# Patient Record
Sex: Male | Born: 1965 | Race: White | Hispanic: No | Marital: Married | Smoking: Former smoker
Health system: Southern US, Community
[De-identification: ages and names within clinical notes are randomized; demographics above are authoritative.]

## PROBLEM LIST (undated history)

## (undated) DIAGNOSIS — J439 Emphysema, unspecified: Secondary | ICD-10-CM

## (undated) DIAGNOSIS — G4730 Sleep apnea, unspecified: Secondary | ICD-10-CM

## (undated) DIAGNOSIS — I5032 Chronic diastolic (congestive) heart failure: Secondary | ICD-10-CM

## (undated) DIAGNOSIS — E785 Hyperlipidemia, unspecified: Secondary | ICD-10-CM

## (undated) DIAGNOSIS — I351 Nonrheumatic aortic (valve) insufficiency: Secondary | ICD-10-CM

## (undated) DIAGNOSIS — R011 Cardiac murmur, unspecified: Secondary | ICD-10-CM

## (undated) DIAGNOSIS — I10 Essential (primary) hypertension: Principal | ICD-10-CM

## (undated) DIAGNOSIS — E782 Mixed hyperlipidemia: Secondary | ICD-10-CM

## (undated) DIAGNOSIS — K760 Fatty (change of) liver, not elsewhere classified: Secondary | ICD-10-CM

## (undated) DIAGNOSIS — K219 Gastro-esophageal reflux disease without esophagitis: Secondary | ICD-10-CM

## (undated) DIAGNOSIS — M24572 Contracture, left ankle: Secondary | ICD-10-CM

## (undated) DIAGNOSIS — M722 Plantar fascial fibromatosis: Secondary | ICD-10-CM

## (undated) HISTORY — DX: Plantar fascial fibromatosis: M72.2

## (undated) HISTORY — DX: Mixed hyperlipidemia: E78.2

## (undated) HISTORY — DX: Contracture, left ankle: M24.572

## (undated) HISTORY — DX: Nonrheumatic aortic (valve) insufficiency: I35.1

## (undated) HISTORY — DX: Hyperlipidemia, unspecified: E78.5

## (undated) HISTORY — DX: Essential (primary) hypertension: I10

---

## 2015-05-07 DIAGNOSIS — E785 Hyperlipidemia, unspecified: Secondary | ICD-10-CM

## 2015-05-07 DIAGNOSIS — I351 Nonrheumatic aortic (valve) insufficiency: Secondary | ICD-10-CM

## 2015-05-07 DIAGNOSIS — I342 Nonrheumatic mitral (valve) stenosis: Secondary | ICD-10-CM | POA: Insufficient documentation

## 2015-05-07 HISTORY — DX: Hyperlipidemia, unspecified: E78.5

## 2015-05-07 HISTORY — DX: Nonrheumatic aortic (valve) insufficiency: I35.1

## 2015-07-17 DIAGNOSIS — M722 Plantar fascial fibromatosis: Secondary | ICD-10-CM

## 2015-07-17 DIAGNOSIS — M24572 Contracture, left ankle: Secondary | ICD-10-CM

## 2015-07-17 HISTORY — DX: Contracture, left ankle: M24.572

## 2015-07-17 HISTORY — DX: Plantar fascial fibromatosis: M72.2

## 2017-03-04 DIAGNOSIS — I10 Essential (primary) hypertension: Secondary | ICD-10-CM

## 2017-03-04 DIAGNOSIS — E782 Mixed hyperlipidemia: Secondary | ICD-10-CM

## 2017-03-04 HISTORY — DX: Mixed hyperlipidemia: E78.2

## 2017-03-04 HISTORY — DX: Essential (primary) hypertension: I10

## 2017-03-12 ENCOUNTER — Other Ambulatory Visit: Payer: Self-pay | Admitting: Physician Assistant

## 2017-03-12 DIAGNOSIS — M5412 Radiculopathy, cervical region: Secondary | ICD-10-CM

## 2017-03-19 ENCOUNTER — Ambulatory Visit
Admission: RE | Admit: 2017-03-19 | Discharge: 2017-03-19 | Disposition: A | Discharge status: Home / Self Care | Payer: BLUE CROSS/BLUE SHIELD | Source: Ambulatory Visit | Attending: Physician Assistant | Admitting: Physician Assistant

## 2017-03-19 DIAGNOSIS — M5412 Radiculopathy, cervical region: Secondary | ICD-10-CM

## 2017-09-10 ENCOUNTER — Encounter: Payer: Self-pay | Admitting: Cardiology

## 2017-09-10 ENCOUNTER — Ambulatory Visit: Payer: BLUE CROSS/BLUE SHIELD | Admitting: Cardiology

## 2017-09-10 VITALS — BP 130/60 | HR 70 | Ht 72.0 in | Wt 257.0 lb

## 2017-09-10 DIAGNOSIS — E785 Hyperlipidemia, unspecified: Secondary | ICD-10-CM | POA: Diagnosis not present

## 2017-09-10 DIAGNOSIS — I351 Nonrheumatic aortic (valve) insufficiency: Secondary | ICD-10-CM

## 2017-09-10 DIAGNOSIS — I10 Essential (primary) hypertension: Secondary | ICD-10-CM

## 2017-09-10 NOTE — Progress Notes (Signed)
Cardiology Office Note:    Date:  09/10/2017   ID:  Jackson Roberts, DOB 13-Jan-1966, MRN [id number]  PCP:  Nonnie Done., MD  Cardiologist:  Gypsy Balsam, MD    Referring MD: Nonnie Done., MD   Chief Complaint  Patient presents with  . Annual Exam  Doing well  History of Present Illness:    Jackson Roberts is a 52 y.o. male with aortic regurgitation as well as aortic stenosis and dyslipidemia overall clinically doing well denies have any chest pain tightness squeezing pressure burning chest no dizziness no passing out.  He described the situation when he was working hard outside and became kind of overheated.  No dizziness no passing out.  Past Medical History:  Diagnosis Date  . 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 Dyslipidemia 05/07/2015  . Essential hypertension 03/04/2017  . Mixed hyperlipidemia 03/04/2017  . Nonrheumatic aortic valve insufficiency 05/07/2015   Moderate in degree  . Plantar fasciitis of right foot 07/17/2015    History reviewed. No pertinent surgical history.  Current Medications: Current Meds  Medication Sig  . amLODipine (NORVASC) 5 MG tablet Take 5 mg by mouth daily.  . Ascorbic Acid (VITAMIN C) 100 MG tablet Take by mouth.  . Cholecalciferol (VITAMIN D3) 5000 units TABS Take by mouth.  . lansoprazole (PREVACID) 15 MG capsule Take by mouth daily.  Marland Kitchen losartan (COZAAR) 100 MG tablet daily.  . Multiple Vitamin (MULTI-VITAMINS) TABS Take by mouth.  . pravastatin (PRAVACHOL) 10 MG tablet Take 5 mg by mouth daily.  . sildenafil (REVATIO) 20 MG tablet TAKE FIVE TABLETS DAILY AS NEEDED  . testosterone cypionate (DEPOTESTOSTERONE CYPIONATE) 200 MG/ML injection   . TOPROL XL 50 MG 24 hr tablet daily.     Allergies:   Penicillin g and Prednisone   Social History   Socioeconomic History  . Marital status: Married    Spouse name: Not on file  . Number of children: Not on file  . Years of education: Not on file  . Highest education  level: Not on file  Occupational History  . Not on file  Social Needs  . Financial resource strain: Not on file  . Food insecurity:    Worry: Not on file    Inability: Not on file  . Transportation needs:    Medical: Not on file    Non-medical: Not on file  Tobacco Use  . Smoking status: Former Smoker    Types: Cigarettes    Last attempt to quit: 09/11/2007    Years since quitting: 10.0  . Smokeless tobacco: Never Used  Substance and Sexual Activity  . Alcohol use: Yes    Alcohol/week: 1.0 standard drinks    Types: 1 Cans of beer per week  . Drug use: Never  . Sexual activity: Not on file  Lifestyle  . Physical activity:    Days per week: Not on file    Minutes per session: Not on file  . Stress: Not on file  Relationships  . Social connections:    Talks on phone: Not on file    Gets together: Not on file    Attends religious service: Not on file    Active member of club or organization: Not on file    Attends meetings of clubs or organizations: Not on file    Relationship status: Not on file  Other Topics Concern  . Not on file  Social History Narrative  . Not on file     Family  History: The patient's family history includes Bone cancer in his maternal grandfather; Hypertension in his father and mother; Mitral valve prolapse in his mother; Stroke in his maternal grandfather. ROS:   Please see the history of present illness.    All 14 point review of systems negative except as described per history of present illness  EKGs/Labs/Other Studies Reviewed:      Recent Labs: No results found for requested labs within last 8760 hours.  Recent Lipid Panel No results found for: CHOL, TRIG, HDL, CHOLHDL, VLDL, LDLCALC, LDLDIRECT  Physical Exam:    VS:  BP 130/60 (BP Location: Right Arm, Patient Position: Sitting, Cuff Size: Normal)   Pulse 70   Ht 6' (1.829 m)   Wt 257 lb (116.6 kg)   SpO2 97%   BMI 34.86 kg/m     Wt Readings from Last 3 Encounters:  09/10/17  257 lb (116.6 kg)     GEN:  Well nourished, well developed in no acute distress HEENT: Normal NECK: No JVD; No carotid bruits LYMPHATICS: No lymphadenopathy CARDIAC: RRR, no murmurs, no rubs, no gallops RESPIRATORY:  Clear to auscultation without rales, wheezing or rhonchi  ABDOMEN: Soft, non-tender, non-distended MUSCULOSKELETAL:  No edema; No deformity  SKIN: Warm and dry LOWER EXTREMITIES: no swelling NEUROLOGIC:  Alert and oriented x 3 PSYCHIATRIC:  Normal affect   ASSESSMENT:    1. Essential hypertension   2. Nonrheumatic aortic valve insufficiency   3. Dyslipidemia    PLAN:    In order of problems listed above:  1. Essential hypertension his blood pressures well controlled we will continue present management. 2. Aortic insufficiency: Start to repeat his echocardiogram will be looking for his ejection fraction as well as left ventricle size 3. Dyslipidemia followed by primary care physician will call the office to get fasting lipid profile.  See him back 6 months sooner if he get a problem   Medication Adjustments/Labs and Tests Ordered: Current medicines are reviewed at length with the patient today.  Concerns regarding medicines are outlined above.  No orders of the defined types were placed in this encounter.  Medication changes: No orders of the defined types were placed in this encounter.   Signed, Georgeanna Leaobert J. Krasowski, MD, [facility] 09/10/2017 9:20 AM    [facility]

## 2017-09-10 NOTE — Patient Instructions (Addendum)
Medication Instructions:  Your physician recommends that you continue on your current medications as directed. Please refer to the Current Medication list given to you today.   Labwork: None.  Testing/Procedures: Your physician has requested that you have an echocardiogram. Echocardiography is a painless test that uses sound waves to create images of your heart. It provides your doctor with information about the size and shape of your heart and how well your heart's chambers and valves are working. This procedure takes approximately one hour. There are no restrictions for this procedure.    Follow-Up: Your physician wants you to follow-up in: 6 months. You will receive a reminder letter in the mail two months in advance. If you don't receive a letter, please call our office to schedule the follow-up appointment.  Any Other Special Instructions Will Be Listed Below (If Applicable).     If you need a refill on your cardiac medications before your next appointment, please call your pharmacy.  Echocardiogram An echocardiogram, or echocardiography, uses sound waves (ultrasound) to produce an image of your heart. The echocardiogram is simple, painless, obtained within a short period of time, and offers valuable information to your health care provider. The images from an echocardiogram can provide information such as:  Evidence of coronary artery disease (CAD).  Heart size.  Heart muscle function.  Heart valve function.  Aneurysm detection.  Evidence of a past heart attack.  Fluid buildup around the heart.  Heart muscle thickening.  Assess heart valve function.  Tell a health care provider about:  Any allergies you have.  All medicines you are taking, including vitamins, herbs, eye drops, creams, and over-the-counter medicines.  Any problems you or family members have had with anesthetic medicines.  Any blood disorders you have.  Any surgeries you have had.  Any  medical conditions you have.  Whether you are pregnant or may be pregnant. What happens before the procedure? No special preparation is needed. Eat and drink normally. What happens during the procedure?  In order to produce an image of your heart, gel will be applied to your chest and a wand-like tool (transducer) will be moved over your chest. The gel will help transmit the sound waves from the transducer. The sound waves will harmlessly bounce off your heart to allow the heart images to be captured in real-time motion. These images will then be recorded.  You may need an IV to receive a medicine that improves the quality of the pictures. What happens after the procedure? You may return to your normal schedule including diet, activities, and medicines, unless your health care provider tells you otherwise. This information is not intended to replace advice given to you by your health care provider. Make sure you discuss any questions you have with your health care provider. Document Released: 01/17/2000 Document Revised: 09/07/2015 Document Reviewed: 09/26/2012 Elsevier Interactive Patient Education  2017 Elsevier Inc.   ] 

## 2017-10-27 ENCOUNTER — Other Ambulatory Visit: Payer: Self-pay

## 2017-10-27 ENCOUNTER — Ambulatory Visit (INDEPENDENT_AMBULATORY_CARE_PROVIDER_SITE_OTHER): Payer: BLUE CROSS/BLUE SHIELD

## 2017-10-27 DIAGNOSIS — I351 Nonrheumatic aortic (valve) insufficiency: Secondary | ICD-10-CM

## 2017-10-27 NOTE — Progress Notes (Signed)
Complete echocardiogram has been performed.  Jimmy Celisa Schoenberg RDCS, RVT 

## 2018-04-19 ENCOUNTER — Telehealth: Payer: Self-pay | Admitting: Emergency Medicine

## 2018-04-19 NOTE — Telephone Encounter (Signed)
Patient rescheduled for follow up appointment per Dr. Bing Matter.

## 2018-04-20 ENCOUNTER — Ambulatory Visit: Payer: BLUE CROSS/BLUE SHIELD | Admitting: Cardiology

## 2018-05-17 ENCOUNTER — Telehealth (INDEPENDENT_AMBULATORY_CARE_PROVIDER_SITE_OTHER): Payer: BLUE CROSS/BLUE SHIELD | Admitting: Cardiology

## 2018-05-17 ENCOUNTER — Encounter: Payer: Self-pay | Admitting: Cardiology

## 2018-05-17 ENCOUNTER — Other Ambulatory Visit: Payer: Self-pay

## 2018-05-17 VITALS — BP 125/60 | HR 70 | Wt 266.0 lb

## 2018-05-17 DIAGNOSIS — E785 Hyperlipidemia, unspecified: Secondary | ICD-10-CM | POA: Diagnosis not present

## 2018-05-17 DIAGNOSIS — I10 Essential (primary) hypertension: Secondary | ICD-10-CM | POA: Diagnosis not present

## 2018-05-17 DIAGNOSIS — I351 Nonrheumatic aortic (valve) insufficiency: Secondary | ICD-10-CM

## 2018-05-17 MED ORDER — TOPROL XL 50 MG PO TB24: 50.0000 mg | ORAL_TABLET | Freq: Every day | ORAL | 3 refills | Status: DC

## 2018-05-17 NOTE — Patient Instructions (Signed)
Medication Instructions:  Your physician recommends that you continue on your current medications as directed. Please refer to the Current Medication list given to you today.  If you need a refill on your cardiac medications before your next appointment, please call your pharmacy.   Lab work: None.  If you have labs (blood work) drawn today and your tests are completely normal, you will receive your results only by: . MyChart Message (if you have MyChart) OR . A paper copy in the mail If you have any lab test that is abnormal or we need to change your treatment, we will call you to review the results.  Testing/Procedures: None.   Follow-Up: At [facility], you and your health needs are our priority.  As part of our continuing mission to provide you with exceptional heart care, we have created designated Provider Care Teams.  These Care Teams include your primary Cardiologist (physician) and Advanced Practice Providers (APPs -  Physician Assistants and Nurse Practitioners) who all work together to provide you with the care you need, when you need it. You will need a follow up appointment in 1 years.  Please call our office 2 months in advance to schedule this appointment.  You may see No primary care provider on file. or another member of our [facility] Provider Team in [place]: Brian Munley, MD . Rajan Revankar, MD  Any Other Special Instructions Will Be Listed Below (If Applicable).     

## 2018-05-17 NOTE — Progress Notes (Signed)
Virtual Visit via Video Note   This visit type was conducted due to national recommendations for restrictions regarding the COVID-19 Pandemic (e.g. social distancing) in an effort to limit this patient's exposure and mitigate transmission in our community.  Due to his co-morbid illnesses, this patient is at least at moderate risk for complications without adequate follow up.  This format is felt to be most appropriate for this patient at this time.  All issues noted in this document were discussed and addressed.  A limited physical exam was performed with this format.  Please refer to the patient's chart for his consent [facility].  Evaluation Performed:  Follow-up visit  This visit type was conducted due to national recommendations for restrictions regarding the COVID-19 Pandemic (e.g. social distancing).  This format is felt to be most appropriate for this patient at this time.  All issues noted in this document were discussed and addressed.  No physical exam was performed (except for noted visual exam findings with Video Visits).  Please refer to the patient's chart (MyChart message for video visits and phone note for telephone visits) for the patient's consent to telehealth [facility].  Date:  05/17/2018  ID: Jackson Roberts, DOB 29-May-1965, MRN [id number]   Patient Location:  [address] [place] [id number]   [facility]   [facility] [place] [facility]  PCP:  Nonnie DoneSlatosky, John J., MD  Cardiologist:  Gypsy Balsamobert Danella Philson, MD     Chief Complaint: Doing well  History of Present Illness:    Jackson LeasKenneth Bankert is a 53 y.o. male  who presents via audio/video conferencing for a telehealth visit today.  With hypertension dyslipidemia also history of questionable bicuspid aortic valve.  We did analyze his echocardiogram he had done in September of last year which showed actually normal left ventricular ejection fraction.  Insignificant aortic  insufficiency no significant stenosis there was a question about potentially having bicuspid aortic valve however overall there was no significant problem with the valve.  He is doing well he staying at home works from home he works for [employer].  He is doing elliptical for 15 minutes 3 times a week.  He also walks outside he try to do about 8000 steps every single day.  He is knowledgeable about diet he is knowledgeable about need to exercise. Denies having any chest pain tightness, squeezing, pressure, burning in the chest no palpitations no dizziness.   The patient does not have symptoms concerning for COVID-19 infection (fever, chills, cough, or new SHORTNESS OF BREATH).    Prior CV studies:   The following studies were reviewed today:  October 27, 2017 showed: Study Conclusions  - Left ventricle: The cavity size was mildly dilated. Wall   thickness was increased increased in a pattern of mild to   moderate LVH. Systolic function was normal. The estimated   ejection fraction was in the range of 55% to 60%. Wall motion was   normal; there were no regional wall motion abnormalities. Left   ventricular diastolic function parameters were normal. - Aortic valve: A bicuspid morphology cannot be excluded; mildly   thickened leaflets. There was no regurgitation directed   eccentrically in the LVOT. - Mitral valve: Valve area by pressure half-time: 2.2 cm^2.     Past Medical History:  Diagnosis Date  . 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 Dyslipidemia 05/07/2015  . Essential hypertension 03/04/2017  . Mixed hyperlipidemia 03/04/2017  . Nonrheumatic aortic valve insufficiency  05/07/2015   Moderate in degree  . Plantar fasciitis of right foot 07/17/2015    No past surgical history on file.   Current Meds  Medication Sig  . Ascorbic Acid (VITAMIN C) 100 MG tablet Take by mouth.  . Cholecalciferol (VITAMIN D3) 5000 units TABS Take by mouth.  . lansoprazole (PREVACID) 15 MG capsule Take  by mouth daily.  Marland Kitchen losartan (COZAAR) 100 MG tablet daily.  . Multiple Vitamin (MULTI-VITAMINS) TABS Take by mouth.  . pravastatin (PRAVACHOL) 10 MG tablet Take 5 mg by mouth daily.  . sildenafil (REVATIO) 20 MG tablet TAKE FIVE TABLETS DAILY AS NEEDED  . testosterone cypionate (DEPOTESTOSTERONE CYPIONATE) 200 MG/ML injection   . TOPROL XL 50 MG 24 hr tablet daily.      Family History: The patient's family history includes Bone cancer in his maternal grandfather; Hypertension in his father and mother; Mitral valve prolapse in his mother; Stroke in his maternal grandfather.   ROS:   Please see the history of present illness.     All other systems reviewed and are negative.   Labs/Other Tests and Data Reviewed:     Recent Labs: No results found for requested labs within last 8760 hours.  Recent Lipid Panel No results found for: CHOL, TRIG, HDL, CHOLHDL, VLDL, LDLCALC, LDLDIRECT    Exam:    Vital Signs:  BP 125/60   Pulse 70   Wt 266 lb (120.7 kg)   BMI 36.08 kg/m     Wt Readings from Last 3 Encounters:  05/17/18 266 lb (120.7 kg)  09/10/17 257 lb (116.6 kg)     Well nourished, well developed male in no acute distress. We used video link for this conversation he is alert awake at the time 3.  Quite cheerful actually doing well no JVD no swelling of lower extremities.  He is quite cheerful and actually happy to be able to talk to me.  Diagnosis for this visit:   1. Nonrheumatic aortic valve insufficiency   2. Dyslipidemia   3. Essential hypertension      ASSESSMENT & PLAN:    1.  Nonrheumatic aortic valve insufficiency minimal if at all noted on echocardiogram.  Aortic valve is not normal however does not have significant deterioration with normal function.  We will simply continue monitoring. 2.  Dyslipidemia will contact primary care physician to get his fasting lipid profile.  He takes pravastatin but only very small dose. 3.  Essential hypertension.  He does  have 2 blood pressure monitors 1 on the wrist and one on the shoulder.  There is significant discrepancy between those 2 blood pressure monitors.  Will check when he will be in our office his blood pressure.  COVID-19 Education: The signs and symptoms of COVID-19 were discussed with the patient and how to seek care for testing (follow up with PCP or arrange E-visit).  The importance of social distancing was discussed today.  Patient Risk:   After full review of this patients clinical status, I feel that they are at least moderate risk at this time.  Time:   Today, I have spent 22 minutes with the patient with telehealth technology discussing pt health issues. Visit was finished at 9:08 AM.    Medication Adjustments/Labs and Tests Ordered: Current medicines are reviewed at length with the patient today.  Concerns regarding medicines are outlined above.  No orders of the defined types were placed in this encounter.  Medication changes: No orders of the defined types were  placed in this encounter.    Disposition: Follow-up 1 year  Signed, Georgeanna Lea, MD, [facility] 05/17/2018 9:02 AM    [facility]

## 2018-08-02 ENCOUNTER — Other Ambulatory Visit: Payer: Self-pay | Admitting: *Deleted

## 2018-08-02 NOTE — Telephone Encounter (Signed)
*  STAT* If patient is at the pharmacy, call can be transferred to refill team.   1. Which medications need to be refilled? (please list name of each medication and dose if known) Toprol XL 50 MG, qd  2. Which pharmacy/location (including street and city if local pharmacy) is medication to be sent to?[facility]  3. Do they need a 30 day or 90 day supply? [place]

## 2018-08-04 MED ORDER — TOPROL XL 50 MG PO TB24: 50.0000 mg | ORAL_TABLET | Freq: Every day | ORAL | 3 refills | Status: DC

## 2018-08-04 NOTE — Telephone Encounter (Signed)
Refill sent in

## 2018-10-26 MED ORDER — METOPROLOL SUCCINATE ER 50 MG PO TB24: 50.0000 mg | ORAL_TABLET | Freq: Every day | ORAL | 3 refills | Status: DC

## 2019-04-04 ENCOUNTER — Telehealth: Payer: Self-pay | Admitting: Cardiology

## 2019-04-04 NOTE — Telephone Encounter (Signed)
Patient is currently taking metoprolol succ er 50 mg daily, can this be changed to tartrate? Will forward to dr Bing Matter to review and advise

## 2019-04-04 NOTE — Telephone Encounter (Signed)
New message  Pt c/o medication issue:  1. Name of Medication: metoprolol succinate (TOPROL XL) 50 MG 24 hr tablet  2. How are you currently taking this medication (dosage and times per day)? As directed  3. Are you having a reaction (difficulty breathing--STAT)? No  4. What is your medication issue? Refill needed, but medication is not covered under the insurance. [facility]  Would like to know if an alternative (metoprolol succinate) can be called in or what needs to be done to get patient his medication. Please advise by end of day today.

## 2019-04-05 MED ORDER — METOPROLOL TARTRATE 25 MG PO TABS: 25.0000 mg | ORAL_TABLET | Freq: Two times a day (BID) | ORAL | 3 refills | Status: DC

## 2019-04-05 NOTE — Telephone Encounter (Signed)
New script sent to the pharmacy

## 2019-04-05 NOTE — Telephone Encounter (Signed)
Yes  it can be changed to Metoprolol tartrate 25 mg po bid

## 2019-06-06 ENCOUNTER — Ambulatory Visit: Payer: BC Managed Care – PPO | Admitting: Cardiology

## 2019-06-06 ENCOUNTER — Encounter: Payer: Self-pay | Admitting: Cardiology

## 2019-06-06 ENCOUNTER — Encounter: Payer: Self-pay | Admitting: Emergency Medicine

## 2019-06-06 ENCOUNTER — Other Ambulatory Visit: Payer: Self-pay

## 2019-06-06 VITALS — BP 120/46 | HR 66 | Ht 72.0 in | Wt 276.0 lb

## 2019-06-06 DIAGNOSIS — I10 Essential (primary) hypertension: Secondary | ICD-10-CM

## 2019-06-06 DIAGNOSIS — I351 Nonrheumatic aortic (valve) insufficiency: Secondary | ICD-10-CM | POA: Diagnosis not present

## 2019-06-06 DIAGNOSIS — E785 Hyperlipidemia, unspecified: Secondary | ICD-10-CM | POA: Diagnosis not present

## 2019-06-06 NOTE — Patient Instructions (Signed)
Medication Instructions:  Your physician recommends that you continue on your current medications as directed. Please refer to the Current Medication list given to you today.  *If you need a refill on your cardiac medications before your next appointment, please call your pharmacy*   Lab Work: None If you have labs (blood work) drawn today and your tests are completely normal, you will receive your results only by: . MyChart Message (if you have MyChart) OR . A paper copy in the mail If you have any lab test that is abnormal or we need to change your treatment, we will call you to review the results.   Testing/Procedures: Your physician has requested that you have an echocardiogram. Echocardiography is a painless test that uses sound waves to create images of your heart. It provides your doctor with information about the size and shape of your heart and how well your heart's chambers and valves are working. This procedure takes approximately one hour. There are no restrictions for this procedure.     Follow-Up: At [facility], you and your health needs are our priority.  As part of our continuing mission to provide you with exceptional heart care, we have created designated Provider Care Teams.  These Care Teams include your primary Cardiologist (physician) and Advanced Practice Providers (APPs -  Physician Assistants and Nurse Practitioners) who all work together to provide you with the care you need, when you need it.  We recommend signing up for the patient portal called "MyChart".  Sign up information is provided on this After Visit Summary.  MyChart is used to connect with patients for Virtual Visits (Telemedicine).  Patients are able to view lab/test results, encounter notes, upcoming appointments, etc.  Non-urgent messages can be sent to your provider as well.   To learn more about what you can do with MyChart, go to [email or web address].com.    Your next appointment:   1  year(s)  The format for your next appointment:   In Person  Provider:   Robert Krasowski, MD   Other Instructions   Echocardiogram An echocardiogram is a procedure that uses painless sound waves (ultrasound) to produce an image of the heart. Images from an echocardiogram can provide important information about:  Signs of coronary artery disease (CAD).  Aneurysm detection. An aneurysm is a weak or damaged part of an artery wall that bulges out from the normal force of blood pumping through the body.  Heart size and shape. Changes in the size or shape of the heart can be associated with certain conditions, including heart failure, aneurysm, and CAD.  Heart muscle function.  Heart valve function.  Signs of a past heart attack.  Fluid buildup around the heart.  Thickening of the heart muscle.  A tumor or infectious growth around the heart valves. Tell a health care provider about:  Any allergies you have.  All medicines you are taking, including vitamins, herbs, eye drops, creams, and over-the-counter medicines.  Any blood disorders you have.  Any surgeries you have had.  Any medical conditions you have.  Whether you are pregnant or may be pregnant. What are the risks? Generally, this is a safe procedure. However, problems may occur, including:  Allergic reaction to dye (contrast) that may be used during the procedure. What happens before the procedure? No specific preparation is needed. You may eat and drink normally. What happens during the procedure?   An IV tube may be inserted into one of your veins.  You may receive   contrast through this tube. A contrast is an injection that improves the quality of the pictures from your heart.  A gel will be applied to your chest.  A wand-like tool (transducer) will be moved over your chest. The gel will help to transmit the sound waves from the transducer.  The sound waves will harmlessly bounce off of your heart to  allow the heart images to be captured in real-time motion. The images will be recorded on a computer. The procedure may vary among health care providers and hospitals. What happens after the procedure?  You may return to your normal, everyday life, including diet, activities, and medicines, unless your health care provider tells you not to do that. Summary  An echocardiogram is a procedure that uses painless sound waves (ultrasound) to produce an image of the heart.  Images from an echocardiogram can provide important information about the size and shape of your heart, heart muscle function, heart valve function, and fluid buildup around your heart.  You do not need to do anything to prepare before this procedure. You may eat and drink normally.  After the echocardiogram is completed, you may return to your normal, everyday life, unless your health care provider tells you not to do that. This information is not intended to replace advice given to you by your health care provider. Make sure you discuss any questions you have with your health care provider. Document Revised: 05/12/2018 Document Reviewed: 02/22/2016 Elsevier Patient Education  2020 Elsevier Inc.   

## 2019-06-06 NOTE — Progress Notes (Signed)
Cardiology Office Note:    Date:  06/06/2019   ID:  Jackson Roberts, DOB 12-19-1965, MRN [id number]  PCP:  Enid Skeens., MD  Cardiologist:  Jenne Campus, MD    Referring MD: Enid Skeens., MD   No chief complaint on file. I am doing well  History of Present Illness:    Jackson Roberts is a 54 y.o. male with past medical history significant for questionable past month which aortic insufficiency, essential hypertension, dyslipidemia.  Comes today to my office for follow-up.  Overall seems to be doing well.  He still active he does have a new dog and walks with his dog quite often he does at least 80,000 steps every day.  On top of that he does have elliptical that he was on the regular basis about 3-4 times a week for about 20 minutes to half an hour he is doing well from that point review.  Denies having any chest pain tightness squeezing pressure burning chest no dizziness no passing out.  Does have some shortness of breath with exertion.  Past Medical History:  Diagnosis Date  . 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 Dyslipidemia 05/07/2015  . Essential hypertension 03/04/2017  . Mixed hyperlipidemia 03/04/2017  . Nonrheumatic aortic valve insufficiency 05/07/2015   Moderate in degree  . Plantar fasciitis of right foot 07/17/2015    History reviewed. No pertinent surgical history.  Current Medications: Current Meds  Medication Sig  . Ascorbic Acid (VITAMIN C) 100 MG tablet Take by mouth.  . candesartan (ATACAND) 8 MG tablet Take 8 mg by mouth daily.  . Cholecalciferol (VITAMIN D3) 5000 units TABS Take by mouth.  . lansoprazole (PREVACID) 15 MG capsule Take by mouth daily.  . metoprolol tartrate (LOPRESSOR) 25 MG tablet Take 1 tablet (25 mg total) by mouth 2 (two) times daily.  . Multiple Vitamin (MULTI-VITAMINS) TABS Take by mouth.  . pravastatin (PRAVACHOL) 10 MG tablet Take 5 mg by mouth daily.  . sildenafil (REVATIO) 20 MG tablet TAKE FIVE TABLETS DAILY AS  NEEDED  . testosterone cypionate (DEPOTESTOSTERONE CYPIONATE) 200 MG/ML injection      Allergies:   Penicillin g and Prednisone   Social History   Socioeconomic History  . Marital status: Married    Spouse name: Not on file  . Number of children: Not on file  . Years of education: Not on file  . Highest education level: Not on file  Occupational History  . Not on file  Tobacco Use  . Smoking status: Former Smoker    Types: Cigarettes    Quit date: 09/11/2007    Years since quitting: 11.7  . Smokeless tobacco: Never Used  Substance and Sexual Activity  . Alcohol use: Yes    Alcohol/week: 1.0 standard drinks    Types: 1 Cans of beer per week  . Drug use: Never  . Sexual activity: Not on file  Other Topics Concern  . Not on file  Social History Narrative  . Not on file   Social Determinants of Health   Financial Resource Strain:   . Difficulty of Paying Living Expenses:   Food Insecurity:   . Worried About Charity fundraiser in the Last Year:   . Arboriculturist in the Last Year:   Transportation Needs:   . Film/video editor (Medical):   Marland Kitchen Lack of Transportation (Non-Medical):   Physical Activity:   . Days of Exercise per Week:   . Minutes of Exercise per  Session:   Stress:   . Feeling of Stress :   Social Connections:   . Frequency of Communication with Friends and Family:   . Frequency of Social Gatherings with Friends and Family:   . Attends Religious Services:   . Active Member of Clubs or Organizations:   . Attends Banker Meetings:   Marland Kitchen Marital Status:      Family History: The patient's family history includes Bone cancer in his maternal grandfather; Hypertension in his father and mother; Mitral valve prolapse in his mother; Stroke in his maternal grandfather. ROS:   Please see the history of present illness.    All 14 point review of systems negative except as described per history of present illness  EKGs/Labs/Other Studies Reviewed:     His EKG today showed normal sinus rhythm, normal P interval, normal QS complex duration morphology, no ST segment changes  Recent Labs: No results found for requested labs within last 8760 hours.  Recent Lipid Panel No results found for: CHOL, TRIG, HDL, CHOLHDL, VLDL, LDLCALC, LDLDIRECT  Physical Exam:    VS:  BP (!) 120/46   Pulse 66   Ht 6' (1.829 m)   Wt 276 lb (125.2 kg)   SpO2 97%   BMI 37.43 kg/m     Wt Readings from Last 3 Encounters:  06/06/19 276 lb (125.2 kg)  05/17/18 266 lb (120.7 kg)  09/10/17 257 lb (116.6 kg)     GEN:  Well nourished, well developed in no acute distress HEENT: Normal NECK: No JVD; No carotid bruits LYMPHATICS: No lymphadenopathy CARDIAC: RRR, diastolic murmur grade 2/6  Undermining 40 sternum, no rubs, no gallops RESPIRATORY:  Clear to auscultation without rales, wheezing or rhonchi  ABDOMEN: Soft, non-tender, non-distended MUSCULOSKELETAL:  No edema; No deformity  SKIN: Warm and dry LOWER EXTREMITIES: no swelling NEUROLOGIC:  Alert and oriented x 3 PSYCHIATRIC:  Normal affect   ASSESSMENT:    1. Nonrheumatic aortic valve insufficiency   2. Essential hypertension   3. Dyslipidemia    PLAN:    In order of problems listed above:  1. Nonrheumatic mitral valve insufficiency (clinically seems to be doing well.  I will ask him to have an echocardiogram done declined findings if truly dealing with bicuspid valve and will subjectively degree of aortic insufficiency. 2. Essential hypertension blood pressure well controlled today.  He is pressure 120/46.  In the future we probably will be forced to cut down he is losartan.  He does have high pulse pressure which make me worry about worsening of aortic insufficiency.  Again echocardiogram will be done for that. 3. Dyslipidemia he is on very small dose of statin only 5 mg pravastatin.  I will call primary care physician to get report of his last fasting profile. 4. We did talk about healthy  lifestyle good diet and exercises on the regular basis he understands will comply.   Medication Adjustments/Labs and Tests Ordered: Current medicines are reviewed at length with the patient today.  Concerns regarding medicines are outlined above.  No orders of the defined types were placed in this encounter.  Medication changes: No orders of the defined types were placed in this encounter.   Signed, Georgeanna Lea, MD, [facility] 06/06/2019 8:52 AM    [facility]

## 2019-06-21 ENCOUNTER — Other Ambulatory Visit: Payer: Self-pay

## 2019-06-21 ENCOUNTER — Ambulatory Visit (INDEPENDENT_AMBULATORY_CARE_PROVIDER_SITE_OTHER): Payer: BC Managed Care – PPO

## 2019-06-21 DIAGNOSIS — I351 Nonrheumatic aortic (valve) insufficiency: Secondary | ICD-10-CM | POA: Diagnosis not present

## 2019-06-21 NOTE — Progress Notes (Signed)
2D Echocardiogram has been performed. Mikinzie Maciejewski RDCS 

## 2019-06-28 ENCOUNTER — Other Ambulatory Visit: Payer: Self-pay

## 2019-06-28 MED ORDER — METOPROLOL TARTRATE 25 MG PO TABS: 25.0000 mg | ORAL_TABLET | Freq: Two times a day (BID) | ORAL | 3 refills | Status: DC

## 2019-07-20 ENCOUNTER — Encounter: Payer: Self-pay | Admitting: Cardiology

## 2019-07-20 NOTE — Telephone Encounter (Signed)
Error

## 2019-07-24 ENCOUNTER — Other Ambulatory Visit: Payer: Self-pay

## 2019-07-24 ENCOUNTER — Encounter: Payer: Self-pay | Admitting: Cardiology

## 2019-07-24 ENCOUNTER — Ambulatory Visit: Payer: BC Managed Care – PPO | Admitting: Cardiology

## 2019-07-24 VITALS — BP 132/56 | HR 84 | Ht 72.0 in | Wt 275.6 lb

## 2019-07-24 DIAGNOSIS — E785 Hyperlipidemia, unspecified: Secondary | ICD-10-CM

## 2019-07-24 DIAGNOSIS — I351 Nonrheumatic aortic (valve) insufficiency: Secondary | ICD-10-CM

## 2019-07-24 DIAGNOSIS — I10 Essential (primary) hypertension: Secondary | ICD-10-CM | POA: Diagnosis not present

## 2019-07-24 NOTE — Patient Instructions (Signed)

## 2019-07-24 NOTE — Progress Notes (Signed)
Cardiology Office Note:    Date:  07/24/2019   ID:  Jackson Roberts, DOB 1965-03-06, MRN [id number]  PCP:  Enid Skeens., MD  Cardiologist:  Jenne Campus, MD    Referring MD: Enid Skeens., MD   Chief Complaint  Patient presents with  . Follow-up    Discuss Echo Results     History of Present Illness:    Jackson Roberts is a 54 y.o. male with past medical history significant for aortic insufficiency, essential hypertension, dyslipidemia.  He comes today to my office for follow-up to discuss results of his echocardiogram that he had done recently.  He still does very well.  He walks a lot he also use elliptical from time to time and have no difficulty doing it.  Denies have any chest pain tightness squeezing pressure burning chest, denies have any unusual shortness of breath.  There is no palpitations there is no swelling of lower extremities.  Past Medical History:  Diagnosis Date  . 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 Dyslipidemia 05/07/2015  . Essential hypertension 03/04/2017  . Mixed hyperlipidemia 03/04/2017  . Nonrheumatic aortic valve insufficiency 05/07/2015   Moderate in degree  . Plantar fasciitis of right foot 07/17/2015    No past surgical history on file.  Current Medications: Current Meds  Medication Sig  . Ascorbic Acid (VITAMIN C) 100 MG tablet Take by mouth.  . candesartan (ATACAND) 8 MG tablet Take 8 mg by mouth daily.  . Cholecalciferol (VITAMIN D3) 5000 units TABS Take by mouth.  . lansoprazole (PREVACID) 15 MG capsule Take by mouth daily.  . metoprolol tartrate (LOPRESSOR) 25 MG tablet Take 1 tablet (25 mg total) by mouth 2 (two) times daily.  . Multiple Vitamin (MULTI-VITAMINS) TABS Take by mouth.  . pravastatin (PRAVACHOL) 10 MG tablet Take 5 mg by mouth daily.  . sildenafil (REVATIO) 20 MG tablet TAKE FIVE TABLETS DAILY AS NEEDED  . testosterone cypionate (DEPOTESTOSTERONE CYPIONATE) 200 MG/ML injection      Allergies:    Penicillin g and Prednisone   Social History   Socioeconomic History  . Marital status: Married    Spouse name: Not on file  . Number of children: Not on file  . Years of education: Not on file  . Highest education level: Not on file  Occupational History  . Not on file  Tobacco Use  . Smoking status: Former Smoker    Types: Cigarettes    Quit date: 09/11/2007    Years since quitting: 11.8  . Smokeless tobacco: Never Used  Substance and Sexual Activity  . Alcohol use: Yes    Alcohol/week: 1.0 standard drink    Types: 1 Cans of beer per week  . Drug use: Never  . Sexual activity: Not on file  Other Topics Concern  . Not on file  Social History Narrative  . Not on file   Social Determinants of Health   Financial Resource Strain:   . Difficulty of Paying Living Expenses:   Food Insecurity:   . Worried About Charity fundraiser in the Last Year:   . Arboriculturist in the Last Year:   Transportation Needs:   . Film/video editor (Medical):   Marland Kitchen Lack of Transportation (Non-Medical):   Physical Activity:   . Days of Exercise per Week:   . Minutes of Exercise per Session:   Stress:   . Feeling of Stress :   Social Connections:   . Frequency of Communication with  Friends and Family:   . Frequency of Social Gatherings with Friends and Family:   . Attends Religious Services:   . Active Member of Clubs or Organizations:   . Attends Banker Meetings:   Marland Kitchen Marital Status:      Family History: The patient's family history includes Bone cancer in his maternal grandfather; Hypertension in his father and mother; Mitral valve prolapse in his mother; Stroke in his maternal grandfather. ROS:   Please see the history of present illness.    All 14 point review of systems negative except as described per history of present illness  EKGs/Labs/Other Studies Reviewed:      Recent Labs: No results found for requested labs within last 8760 hours.  Recent Lipid  Panel No results found for: CHOL, TRIG, HDL, CHOLHDL, VLDL, LDLCALC, LDLDIRECT  Physical Exam:    VS:  BP (!) 132/56 (BP Location: Left Arm, Patient Position: Sitting, Cuff Size: Large)   Pulse 84   Ht 6' (1.829 m)   Wt 275 lb 9.6 oz (125 kg)   SpO2 99%   BMI 37.38 kg/m     Wt Readings from Last 3 Encounters:  07/24/19 275 lb 9.6 oz (125 kg)  06/06/19 276 lb (125.2 kg)  05/17/18 266 lb (120.7 kg)     GEN:  Well nourished, well developed in no acute distress HEENT: Normal NECK: No JVD; No carotid bruits LYMPHATICS: No lymphadenopathy CARDIAC: RRR, diastolic murmur grade 2/6 best heard left border of the sternum, no rubs, no gallops RESPIRATORY:  Clear to auscultation without rales, wheezing or rhonchi  ABDOMEN: Soft, non-tender, non-distended MUSCULOSKELETAL:  No edema; No deformity  SKIN: Warm and dry LOWER EXTREMITIES: no swelling NEUROLOGIC:  Alert and oriented x 3 PSYCHIATRIC:  Normal affect   ASSESSMENT:    1. Nonrheumatic aortic valve insufficiency   2. Essential hypertension   3. Dyslipidemia    PLAN:    In order of problems listed above:  1. Normal aortic valve insufficiency.  He is insufficiency is moderate to severe.  We discussed pathophysiology of the problem as well as natural history of this problem.  Likely he is asymptomatic, his left ventricle ejection fraction is still more than 60%, his end-diastolic diameter is 5.7.  His end-systolic diameter is 4.1.  Therefore we dealing with stage C1.  His New York Heart Association congestive heart rate classification is 1, this is severe aortic insufficiency with compensated left ventricle.  Recommendation for this is simply monitoring it.  He asked me questions of possible his blood pressure was high during the time of the study and that is why his aortic insufficiency is significant.  I told him yesterday possibility, however, his blood pressure was not sufficiently high just to blame aortic insufficiency on it.  The  point will be to watch him clinically.  I told him he developed shortness of breath tiredness fatigue some new symptoms he need to let me know immediately if not we can talk again about 3 months and then will repeat his echocardiogram in about 6 months. 2. Essential hypertension blood pressure seems to be well controlled.  We need to be careful with lowering blood pressure too much because of wild pulse pressure which is typical for arctic insufficiency. 3. Dyslipidemia: We will call his primary care physician to get copy of fasting lipid profile   Medication Adjustments/Labs and Tests Ordered: Current medicines are reviewed at length with the patient today.  Concerns regarding medicines are outlined above.  No orders of the defined types were placed in this encounter.  Medication changes: No orders of the defined types were placed in this encounter.   Signed, Georgeanna Lea, MD, [facility] 07/24/2019 10:56 AM    [facility]

## 2019-11-02 ENCOUNTER — Encounter: Payer: Self-pay | Admitting: Cardiology

## 2019-11-02 ENCOUNTER — Other Ambulatory Visit: Payer: Self-pay

## 2019-11-02 ENCOUNTER — Ambulatory Visit (INDEPENDENT_AMBULATORY_CARE_PROVIDER_SITE_OTHER): Payer: BC Managed Care – PPO | Admitting: Cardiology

## 2019-11-02 VITALS — BP 130/50 | HR 72 | Ht 72.0 in | Wt 283.0 lb

## 2019-11-02 DIAGNOSIS — I10 Essential (primary) hypertension: Secondary | ICD-10-CM | POA: Diagnosis not present

## 2019-11-02 DIAGNOSIS — E785 Hyperlipidemia, unspecified: Secondary | ICD-10-CM

## 2019-11-02 DIAGNOSIS — I351 Nonrheumatic aortic (valve) insufficiency: Secondary | ICD-10-CM

## 2019-11-02 NOTE — Progress Notes (Signed)
Cardiology Office Note:    Date:  11/02/2019   ID:  Jackson Roberts, DOB Jun 08, 1965, MRN [id number]  PCP:  Nonnie Done., MD  Cardiologist:  Gypsy Balsam, MD    Referring MD: Nonnie Done., MD   Chief Complaint  Patient presents with  . Follow-up  Doing well  History of Present Illness:    Jackson Roberts is a 54 y.o. male with moderate to severe aortic insufficiency, however normal left ventricle ejection fraction, lower normal size, asymptomatic.  Its states he 1.  Also history of essential hypertension, dyslipidemia.  Comes today to my office for follow-up.  Overall doing great.  [place] and down with no difficulties.  Denies having a palpitation no swelling of lower extremities no dizziness no passing out.  Past Medical History:  Diagnosis Date  . 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 Dyslipidemia 05/07/2015  . Essential hypertension 03/04/2017  . Mixed hyperlipidemia 03/04/2017  . Nonrheumatic aortic valve insufficiency 05/07/2015   Moderate in degree  . Plantar fasciitis of right foot 07/17/2015    No past surgical history on file.  Current Medications: No outpatient medications have been marked as taking for the 11/02/19 encounter (Office Visit) with Georgeanna Lea, MD.     Allergies:   Penicillin g and Prednisone   Social History   Socioeconomic History  . Marital status: Married    Spouse name: Not on file  . Number of children: Not on file  . Years of education: Not on file  . Highest education level: Not on file  Occupational History  . Not on file  Tobacco Use  . Smoking status: Former Smoker    Types: Cigarettes    Quit date: 09/11/2007    Years since quitting: 12.1  . Smokeless tobacco: Never Used  Substance and Sexual Activity  . Alcohol use: Yes    Alcohol/week: 1.0 standard drink    Types: 1 Cans of beer per week  . Drug use: Never  . Sexual activity: Not on file  Other  Topics Concern  . Not on file  Social History Narrative  . Not on file   Social Determinants of Health   Financial Resource Strain:   . Difficulty of Paying Living Expenses: Not on file  Food Insecurity:   . Worried About Programme researcher, broadcasting/film/video in the Last Year: Not on file  . Ran Out of Food in the Last Year: Not on file  Transportation Needs:   . Lack of Transportation (Medical): Not on file  . Lack of Transportation (Non-Medical): Not on file  Physical Activity:   . Days of Exercise per Week: Not on file  . Minutes of Exercise per Session: Not on file  Stress:   . Feeling of Stress : Not on file  Social Connections:   . Frequency of Communication with Friends and Family: Not on file  . Frequency of Social Gatherings with Friends and Family: Not on file  . Attends Religious Services: Not on file  . Active Member of Clubs or Organizations: Not on file  . Attends Banker Meetings: Not on file  . Marital Status: Not on file     Family History: The patient's family history includes Bone cancer in his maternal grandfather; Hypertension in his father and mother; Mitral valve prolapse in his mother; Stroke in his maternal grandfather. ROS:   Please see the history of present illness.  All 14 point review of systems negative except as described per history of present illness  EKGs/Labs/Other Studies Reviewed:      Recent Labs: No results found for requested labs within last 8760 hours.  Recent Lipid Panel No results found for: CHOL, TRIG, HDL, CHOLHDL, VLDL, LDLCALC, LDLDIRECT  Physical Exam:    VS:  BP (!) 130/50 (BP Location: Left Arm, Patient Position: Sitting, Cuff Size: Large)   Pulse 72   Ht 6' (1.829 m)   Wt 283 lb (128.4 kg)   SpO2 98%   BMI 38.38 kg/m     Wt Readings from Last 3 Encounters:  11/02/19 283 lb (128.4 kg)  07/24/19 275 lb 9.6 oz (125 kg)  06/06/19 276 lb (125.2 kg)     GEN:  Well nourished, well developed in no acute  distress HEENT: Normal NECK: No JVD; No carotid bruits LYMPHATICS: No lymphadenopathy CARDIAC: RRR, holodiastolic murmur best heard left border of the sternum grade 3/6, no rubs, no gallops RESPIRATORY:  Clear to auscultation without rales, wheezing or rhonchi  ABDOMEN: Soft, non-tender, non-distended MUSCULOSKELETAL:  No edema; No deformity  SKIN: Warm and dry LOWER EXTREMITIES: no swelling NEUROLOGIC:  Alert and oriented x 3 PSYCHIATRIC:  Normal affect   ASSESSMENT:    1. Nonrheumatic aortic valve insufficiency   2. Essential hypertension   3. Dyslipidemia    PLAN:    In order of problems listed above:  1. Aortic insufficiency which is moderate to severe, normal ejection fraction normal left ventricle size, completely asymptomatic at stage C1.  I will repeat his echocardiogram make sure we still maintain proper ejection fraction as well as LV size.  So far no indications for intervention. 2. Essential hypertension: Blood pressure well controlled continue present management. 3. Dyslipidemia: Will call his primary care physician to get fasting lipid profile   Medication Adjustments/Labs and Tests Ordered: Current medicines are reviewed at length with the patient today.  Concerns regarding medicines are outlined above.  Orders Placed This Encounter  Procedures  . ECHOCARDIOGRAM COMPLETE   Medication changes: No orders of the defined types were placed in this encounter.   Signed, Georgeanna Lea, MD, [facility] 11/02/2019 3:31 PM    [facility]

## 2019-11-02 NOTE — Patient Instructions (Signed)
Medication Instructions:  °Your physician recommends that you continue on your current medications as directed. Please refer to the Current Medication list given to you today. ° °*If you need a refill on your cardiac medications before your next appointment, please call your pharmacy* ° ° °Lab Work: °None. ° °If you have labs (blood work) drawn today and your tests are completely normal, you will receive your results only by: °• MyChart Message (if you have MyChart) OR °• A paper copy in the mail °If you have any lab test that is abnormal or we need to change your treatment, we will call you to review the results. ° ° °Testing/Procedures: °Your physician has requested that you have an echocardiogram. Echocardiography is a painless test that uses sound waves to create images of your heart. It provides your doctor with information about the size and shape of your heart and how well your heart’s chambers and valves are working. This procedure takes approximately one hour. There are no restrictions for this procedure. ° ° ° ° °Follow-Up: °At [facility], you and your health needs are our priority.  As part of our continuing mission to provide you with exceptional heart care, we have created designated Provider Care Teams.  These Care Teams include your primary Cardiologist (physician) and Advanced Practice Providers (APPs -  Physician Assistants and Nurse Practitioners) who all work together to provide you with the care you need, when you need it. ° °We recommend signing up for the patient portal called "MyChart".  Sign up information is provided on this After Visit Summary.  MyChart is used to connect with patients for Virtual Visits (Telemedicine).  Patients are able to view lab/test results, encounter notes, upcoming appointments, etc.  Non-urgent messages can be sent to your provider as well.   °To learn more about what you can do with MyChart, go to [email or web address].   ° °Your next appointment:   °6  month(s) ° °The format for your next appointment:   °In Person ° °Provider:   °Robert Krasowski, MD ° ° °Other Instructions ° ° °Echocardiogram °An echocardiogram is a procedure that uses painless sound waves (ultrasound) to produce an image of the heart. Images from an echocardiogram can provide important information about: °· Signs of coronary artery disease (CAD). °· Aneurysm detection. An aneurysm is a weak or damaged part of an artery wall that bulges out from the normal force of blood pumping through the body. °· Heart size and shape. Changes in the size or shape of the heart can be associated with certain conditions, including heart failure, aneurysm, and CAD. °· Heart muscle function. °· Heart valve function. °· Signs of a past heart attack. °· Fluid buildup around the heart. °· Thickening of the heart muscle. °· A tumor or infectious growth around the heart valves. °Tell a health care provider about: °· Any allergies you have. °· All medicines you are taking, including vitamins, herbs, eye drops, creams, and over-the-counter medicines. °· Any blood disorders you have. °· Any surgeries you have had. °· Any medical conditions you have. °· Whether you are pregnant or may be pregnant. °What are the risks? °Generally, this is a safe procedure. However, problems may occur, including: °· Allergic reaction to dye (contrast) that may be used during the procedure. °What happens before the procedure? °No specific preparation is needed. You may eat and drink normally. °What happens during the procedure? ° °· An IV tube may be inserted into one of your veins. °· You may   receive contrast through this tube. A contrast is an injection that improves the quality of the pictures from your heart. °· A gel will be applied to your chest. °· A wand-like tool (transducer) will be moved over your chest. The gel will help to transmit the sound waves from the transducer. °· The sound waves will harmlessly bounce off of your heart to  allow the heart images to be captured in real-time motion. The images will be recorded on a computer. °The procedure may vary among health care providers and hospitals. °What happens after the procedure? °· You may return to your normal, everyday life, including diet, activities, and medicines, unless your health care provider tells you not to do that. °Summary °· An echocardiogram is a procedure that uses painless sound waves (ultrasound) to produce an image of the heart. °· Images from an echocardiogram can provide important information about the size and shape of your heart, heart muscle function, heart valve function, and fluid buildup around your heart. °· You do not need to do anything to prepare before this procedure. You may eat and drink normally. °· After the echocardiogram is completed, you may return to your normal, everyday life, unless your health care provider tells you not to do that. °This information is not intended to replace advice given to you by your health care provider. Make sure you discuss any questions you have with your health care provider. °Document Revised: 05/12/2018 Document Reviewed: 02/22/2016 °Elsevier Patient Education © 2020 [employer] ° ° °

## 2019-11-27 ENCOUNTER — Ambulatory Visit (INDEPENDENT_AMBULATORY_CARE_PROVIDER_SITE_OTHER): Payer: BC Managed Care – PPO

## 2019-11-27 ENCOUNTER — Other Ambulatory Visit: Payer: Self-pay

## 2019-11-27 DIAGNOSIS — I351 Nonrheumatic aortic (valve) insufficiency: Secondary | ICD-10-CM

## 2019-11-27 LAB — ECHOCARDIOGRAM COMPLETE
Area-P 1/2: 2.22 cm2
Calc EF: 58.3 %
S' Lateral: 4.3 cm
Single Plane A2C EF: 56.4 %
Single Plane A4C EF: 59.8 %

## 2020-01-17 ENCOUNTER — Ambulatory Visit: Payer: BC Managed Care – PPO | Admitting: Cardiology

## 2020-01-17 ENCOUNTER — Encounter: Payer: Self-pay | Admitting: Cardiology

## 2020-01-17 ENCOUNTER — Other Ambulatory Visit: Payer: Self-pay

## 2020-01-17 VITALS — BP 150/62 | HR 75 | Ht 72.0 in | Wt 284.0 lb

## 2020-01-17 DIAGNOSIS — I342 Nonrheumatic mitral (valve) stenosis: Secondary | ICD-10-CM

## 2020-01-17 DIAGNOSIS — R42 Dizziness and giddiness: Secondary | ICD-10-CM

## 2020-01-17 DIAGNOSIS — R5383 Other fatigue: Secondary | ICD-10-CM | POA: Diagnosis not present

## 2020-01-17 DIAGNOSIS — E785 Hyperlipidemia, unspecified: Secondary | ICD-10-CM

## 2020-01-17 DIAGNOSIS — I351 Nonrheumatic aortic (valve) insufficiency: Secondary | ICD-10-CM

## 2020-01-17 DIAGNOSIS — I10 Essential (primary) hypertension: Secondary | ICD-10-CM

## 2020-01-17 HISTORY — DX: Dizziness and giddiness: R42

## 2020-01-17 NOTE — Progress Notes (Signed)
Cardiology Office Note:    Date:  01/17/2020   ID:  Jackson Roberts, DOB 02-09-1965, MRN [id number]  PCP:  Nonnie Done., MD  Cardiologist:  Gypsy Balsam, MD    Referring MD: Nonnie Done., MD   Chief Complaint  Patient presents with  . Dizziness  . Fatigue  . Shortness of Breath    History of Present Illness:    Jackson Roberts is a 54 y.o. male with past medical history significant for moderate to severe aortic insufficiency based on echocardiogram in summer of last year, essential hypertension, mild mitral stenosis, dyslipidemia.  Comes today 2 months of follow-up he complained of being dizzy said sometimes when he gets up.  He is getting dizzy he never passed out, his wife was with him in the room she said that he is getting short of breath quite easily at the same time he is able to go and do elliptical for about 30 minutes at a speed of 3 mph.  Denies have any chest pain tightness squeezing pressure burning chest.  There is no swelling of lower extremities no proximal nocturnal dyspnea.  Past Medical History:  Diagnosis Date  . 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 Dyslipidemia 05/07/2015  . Essential hypertension 03/04/2017  . Mixed hyperlipidemia 03/04/2017  . Nonrheumatic aortic valve insufficiency 05/07/2015   Moderate in degree  . Plantar fasciitis of right foot 07/17/2015    History reviewed. No pertinent surgical history.  Current Medications: Current Meds  Medication Sig  . albuterol (VENTOLIN HFA) 108 (90 Base) MCG/ACT inhaler Inhale 2 puffs into the lungs as needed.  . Ascorbic Acid (VITAMIN C) 100 MG tablet Take by mouth.  . candesartan (ATACAND) 8 MG tablet Take 8 mg by mouth daily.  . Cholecalciferol (VITAMIN D3) 5000 units TABS Take by mouth.  . EPINEPHrine 0.3 mg/0.3 mL IJ SOAJ injection See admin instructions.  . lansoprazole (PREVACID) 15 MG capsule Take by mouth daily.  . metoprolol tartrate (LOPRESSOR) 25 MG tablet Take 1 tablet (25  mg total) by mouth 2 (two) times daily.  . Multiple Vitamin (MULTI-VITAMINS) TABS Take by mouth.  . pravastatin (PRAVACHOL) 10 MG tablet Take 5 mg by mouth daily.  . sildenafil (REVATIO) 20 MG tablet TAKE FIVE TABLETS DAILY AS NEEDED  . Testosterone 20.25 MG/ACT (1.62%) GEL SMARTSIG:2 Pump Topical Every Morning     Allergies:   Penicillin g and Prednisone   Social History   Socioeconomic History  . Marital status: Married    Spouse name: Not on file  . Number of children: Not on file  . Years of education: Not on file  . Highest education level: Not on file  Occupational History  . Not on file  Tobacco Use  . Smoking status: Former Smoker    Types: Cigarettes    Quit date: 09/11/2007    Years since quitting: 12.3  . Smokeless tobacco: Never Used  Substance and Sexual Activity  . Alcohol use: Yes    Alcohol/week: 1.0 standard drink    Types: 1 Cans of beer per week  . Drug use: Never  . Sexual activity: Not on file  Other Topics Concern  . Not on file  Social History Narrative  . Not on file   Social Determinants of Health   Financial Resource Strain: Not on file  Food Insecurity: Not on file  Transportation Needs: Not on file  Physical Activity: Not on file  Stress: Not on file  Social Connections: Not on file  Family History: The patient's family history includes Bone cancer in his maternal grandfather; Hypertension in his father and mother; Mitral valve prolapse in his mother; Stroke in his maternal grandfather. ROS:   Please see the history of present illness.    All 14 point review of systems negative except as described per history of present illness  EKGs/Labs/Other Studies Reviewed:      Recent Labs: No results found for requested labs within last 8760 hours.  Recent Lipid Panel No results found for: CHOL, TRIG, HDL, CHOLHDL, VLDL, LDLCALC, LDLDIRECT  Physical Exam:    VS:  BP (!) 150/62 (BP Location: Right Arm, Patient Position: Sitting)    Pulse 75   Ht 6' (1.829 m)   Wt 284 lb (128.8 kg)   SpO2 97%   BMI 38.52 kg/m     Wt Readings from Last 3 Encounters:  01/17/20 284 lb (128.8 kg)  11/02/19 283 lb (128.4 kg)  07/24/19 275 lb 9.6 oz (125 kg)     GEN:  Well nourished, well developed in no acute distress HEENT: Normal NECK: No JVD; No carotid bruits LYMPHATICS: No lymphadenopathy CARDIAC: RRR, no murmurs, no rubs, no gallops RESPIRATORY:  Clear to auscultation without rales, wheezing or rhonchi  ABDOMEN: Soft, non-tender, non-distended MUSCULOSKELETAL:  No edema; No deformity  SKIN: Warm and dry LOWER EXTREMITIES: no swelling NEUROLOGIC:  Alert and oriented x 3 PSYCHIATRIC:  Normal affect   ASSESSMENT:    1. Fatigue, unspecified type   2. Aortic valve insufficiency, etiology of cardiac valve disease unspecified   3. Nonrheumatic aortic valve insufficiency   4. Non-rheumatic mitral valve stenosis   5. Essential hypertension   6. Dyslipidemia   7. Dizziness    PLAN:    In order of problems listed above:  1. Aortic insufficiency.  Last echocardiogram did not mention anything about what insufficiency.  I did look at the study I can see aortic insufficiency but not significant.  Previously it was moderate to severe.  Potential explanation for this lady is the fact his blood pressure was high prior to start of this 1 was not pretty well controlled.  I think we decided to repeat another focus echocardiogram looking at the aortic valve make sure we understand degree of regurgitation, I want to do this especially in view of the fact that he does have a drop in his ejection fraction which would be significant finding for somebody with significant arctic insufficiency.  I asked him to stay well-hydrated.  This to prevent episode of what appears to be orthostatic hypotension however today changes of orthostasis did not show any significance. 2. Essential hypertension blood pressure well controlled continue present  management. 3. dyslipidemia: He is on statin which I will continue.       Medication Adjustments/Labs and Tests Ordered: Current medicines are reviewed at length with the patient today.  Concerns regarding medicines are outlined above.  Orders Placed This Encounter  Procedures  . EKG 12-Lead  . ECHOCARDIOGRAM LIMITED   Medication changes: No orders of the defined types were placed in this encounter.   Signed, Georgeanna Lea, MD, [facility] 01/17/2020 2:35 PM    [facility]

## 2020-01-17 NOTE — Patient Instructions (Signed)
Medication Instructions:  Your physician recommends that you continue on your current medications as directed. Please refer to the Current Medication list given to you today.  *If you need a refill on your cardiac medications before your next appointment, please call your pharmacy*   Lab Work: None.  If you have labs (blood work) drawn today and your tests are completely normal, you will receive your results only by: . MyChart Message (if you have MyChart) OR . A paper copy in the mail If you have any lab test that is abnormal or we need to change your treatment, we will call you to review the results.   Testing/Procedures: Your physician has requested that you have an echocardiogram. Echocardiography is a painless test that uses sound waves to create images of your heart. It provides your doctor with information about the size and shape of your heart and how well your heart's chambers and valves are working. This procedure takes approximately one hour. There are no restrictions for this procedure.     Follow-Up: At [facility], you and your health needs are our priority.  As part of our continuing mission to provide you with exceptional heart care, we have created designated Provider Care Teams.  These Care Teams include your primary Cardiologist (physician) and Advanced Practice Providers (APPs -  Physician Assistants and Nurse Practitioners) who all work together to provide you with the care you need, when you need it.  We recommend signing up for the patient portal called "MyChart".  Sign up information is provided on this After Visit Summary.  MyChart is used to connect with patients for Virtual Visits (Telemedicine).  Patients are able to view lab/test results, encounter notes, upcoming appointments, etc.  Non-urgent messages can be sent to your provider as well.   To learn more about what you can do with MyChart, go to [email or web address].    Your next appointment:   2  week(s)  The format for your next appointment:   In Person  Provider:   Robert Krasowski, MD   Other Instructions   Echocardiogram An echocardiogram is a procedure that uses painless sound waves (ultrasound) to produce an image of the heart. Images from an echocardiogram can provide important information about:  Signs of coronary artery disease (CAD).  Aneurysm detection. An aneurysm is a weak or damaged part of an artery wall that bulges out from the normal force of blood pumping through the body.  Heart size and shape. Changes in the size or shape of the heart can be associated with certain conditions, including heart failure, aneurysm, and CAD.  Heart muscle function.  Heart valve function.  Signs of a past heart attack.  Fluid buildup around the heart.  Thickening of the heart muscle.  A tumor or infectious growth around the heart valves. Tell a health care provider about:  Any allergies you have.  All medicines you are taking, including vitamins, herbs, eye drops, creams, and over-the-counter medicines.  Any blood disorders you have.  Any surgeries you have had.  Any medical conditions you have.  Whether you are pregnant or may be pregnant. What are the risks? Generally, this is a safe procedure. However, problems may occur, including:  Allergic reaction to dye (contrast) that may be used during the procedure. What happens before the procedure? No specific preparation is needed. You may eat and drink normally. What happens during the procedure?   An IV tube may be inserted into one of your veins.  You may   receive contrast through this tube. A contrast is an injection that improves the quality of the pictures from your heart.  A gel will be applied to your chest.  A wand-like tool (transducer) will be moved over your chest. The gel will help to transmit the sound waves from the transducer.  The sound waves will harmlessly bounce off of your heart to  allow the heart images to be captured in real-time motion. The images will be recorded on a computer. The procedure may vary among health care providers and hospitals. What happens after the procedure?  You may return to your normal, everyday life, including diet, activities, and medicines, unless your health care provider tells you not to do that. Summary  An echocardiogram is a procedure that uses painless sound waves (ultrasound) to produce an image of the heart.  Images from an echocardiogram can provide important information about the size and shape of your heart, heart muscle function, heart valve function, and fluid buildup around your heart.  You do not need to do anything to prepare before this procedure. You may eat and drink normally.  After the echocardiogram is completed, you may return to your normal, everyday life, unless your health care provider tells you not to do that. This information is not intended to replace advice given to you by your health care provider. Make sure you discuss any questions you have with your health care provider. Document Revised: 05/12/2018 Document Reviewed: 02/22/2016 Elsevier Patient Education  2020 Elsevier Inc.  

## 2020-02-22 ENCOUNTER — Other Ambulatory Visit: Payer: Self-pay | Admitting: Cardiology

## 2020-02-22 ENCOUNTER — Ambulatory Visit (INDEPENDENT_AMBULATORY_CARE_PROVIDER_SITE_OTHER): Payer: BC Managed Care – PPO

## 2020-02-22 ENCOUNTER — Other Ambulatory Visit: Payer: Self-pay

## 2020-02-22 DIAGNOSIS — I351 Nonrheumatic aortic (valve) insufficiency: Secondary | ICD-10-CM

## 2020-02-22 LAB — ECHOCARDIOGRAM COMPLETE
Area-P 1/2: 4.21 cm2
P 1/2 time: 333 msec
S' Lateral: 4.6 cm
Single Plane A4C EF: 59.3 %

## 2020-02-22 MED ORDER — PERFLUTREN LIPID MICROSPHERE: 1.0000 mL | INTRAVENOUS | Status: AC | PRN

## 2020-02-22 NOTE — Progress Notes (Signed)
Complete echocardiogram with contrast performed.  Jimmy Aulden Calise RDCS, RVT  

## 2020-02-26 ENCOUNTER — Encounter: Payer: Self-pay | Admitting: Cardiology

## 2020-02-26 ENCOUNTER — Other Ambulatory Visit: Payer: Self-pay

## 2020-02-26 ENCOUNTER — Ambulatory Visit: Payer: BC Managed Care – PPO | Admitting: Cardiology

## 2020-02-26 VITALS — BP 128/58 | HR 81 | Ht 72.0 in | Wt 283.0 lb

## 2020-02-26 DIAGNOSIS — E785 Hyperlipidemia, unspecified: Secondary | ICD-10-CM

## 2020-02-26 DIAGNOSIS — E782 Mixed hyperlipidemia: Secondary | ICD-10-CM

## 2020-02-26 DIAGNOSIS — I10 Essential (primary) hypertension: Secondary | ICD-10-CM

## 2020-02-26 DIAGNOSIS — I351 Nonrheumatic aortic (valve) insufficiency: Secondary | ICD-10-CM

## 2020-02-26 MED ORDER — METOPROLOL TARTRATE 50 MG PO TABS: 50.0000 mg | ORAL_TABLET | Freq: Every day | ORAL | 3 refills | Status: DC

## 2020-02-26 NOTE — Progress Notes (Signed)
Cardiology Office Note:    Date:  02/26/2020   ID:  Jackson Roberts, DOB July 20, 1965, MRN [id number]  PCP:  Nonnie Done., MD  Cardiologist:  Gypsy Balsam, MD    Referring MD: Nonnie Done., MD   Chief Complaint  Patient presents with  . Results    Echo    History of Present Illness:    Jackson Roberts is a 55 y.o. 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  He comes today to my office discuss results of his echocardiogram.  Overall he is doing well.  Denies have any chest pain tightness squeezing pressure burning chest no dizziness he did not notice any decrease in exercise tolerance.  He exercised on the regular basis he can exercise for 30 minutes have no problem when asking if there is any changes in his physical ability within the last 6 months he answered absolutely not.  However, his echocardiogram being follow-up in 6 months interval showing gradual increase in size of the left ventricle.  He is here to talk about potentially getting ready for arctic valve replacement.  Past Medical History:  Diagnosis Date  . 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 Dizziness 01/17/2020  . Dyslipidemia 05/07/2015  . Essential hypertension 03/04/2017  . Mixed hyperlipidemia 03/04/2017  . Non-rheumatic mitral valve stenosis 05/07/2015   Mild in degree  Formatting of this note might be different from the original. Mild in degree  . Nonrheumatic aortic valve insufficiency 05/07/2015   Moderate in degree  . Plantar fasciitis of right foot 07/17/2015    Past Surgical History:  Procedure Laterality Date  . NO PAST SURGERIES      Current Medications: Current Meds  Medication Sig  . albuterol (VENTOLIN HFA) 108 (90 Base) MCG/ACT inhaler Inhale 2 puffs into the lungs as needed.  . Ascorbic Acid (VITAMIN C) 100 MG tablet Take by mouth.  . candesartan (ATACAND) 8 MG tablet Take 8 mg by mouth daily.  .  Cholecalciferol (VITAMIN D3) 5000 units TABS Take by mouth.  . EPINEPHrine 0.3 mg/0.3 mL IJ SOAJ injection See admin instructions.  . lansoprazole (PREVACID) 15 MG capsule Take by mouth daily.  . metoprolol tartrate (LOPRESSOR) 25 MG tablet Take 1 tablet (25 mg total) by mouth 2 (two) times daily.  . Multiple Vitamin (MULTI-VITAMINS) TABS Take by mouth.  . pravastatin (PRAVACHOL) 10 MG tablet Take 5 mg by mouth daily.  . sildenafil (REVATIO) 20 MG tablet TAKE FIVE TABLETS DAILY AS NEEDED  . Testosterone 20.25 MG/ACT (1.62%) GEL SMARTSIG:2 Pump Topical Every Morning     Allergies:   Penicillin g and Prednisone   Social History   Socioeconomic History  . Marital status: Married    Spouse name: Not on file  . Number of children: Not on file  . Years of education: Not on file  . Highest education level: Not on file  Occupational History  . Not on file  Tobacco Use  . Smoking status: Former Smoker    Types: Cigarettes    Quit date: 09/11/2007    Years since quitting: 12.4  . Smokeless tobacco: Never Used  Substance and Sexual Activity  . Alcohol use: Yes    Alcohol/week: 1.0 standard drink    Types: 1 Cans of beer per week  . Drug use: Never  . Sexual activity: Not on file  Other Topics Concern  . Not on file  Social History Narrative  . Not on file  Social Determinants of Health   Financial Resource Strain: Not on file  Food Insecurity: Not on file  Transportation Needs: Not on file  Physical Activity: Not on file  Stress: Not on file  Social Connections: Not on file     Family History: The patient's family history includes Bone cancer in his maternal grandfather; Hypertension in his father and mother; Mitral valve prolapse in his mother; Stroke in his maternal grandfather. ROS:   Please see the history of present illness.    All 14 point review of systems negative except as described per history of present illness  EKGs/Labs/Other Studies Reviewed:      Recent  Labs: No results found for requested labs within last 8760 hours.  Recent Lipid Panel No results found for: CHOL, TRIG, HDL, CHOLHDL, VLDL, LDLCALC, LDLDIRECT  Physical Exam:    VS:  BP (!) 128/58 (BP Location: Left Arm, Patient Position: Sitting)   Pulse 81   Ht 6' (1.829 m)   Wt 283 lb (128.4 kg)   SpO2 94%   BMI 38.38 kg/m     Wt Readings from Last 3 Encounters:  02/26/20 283 lb (128.4 kg)  01/17/20 284 lb (128.8 kg)  11/02/19 283 lb (128.4 kg)     GEN:  Well nourished, well developed in no acute distress HEENT: Normal NECK: No JVD; No carotid bruits LYMPHATICS: No lymphadenopathy CARDIAC: RRR, holodiastolic murmur best heard left border of the sternum grade 2/6 to 3/6, no rubs, no gallops RESPIRATORY:  Clear to auscultation without rales, wheezing or rhonchi  ABDOMEN: Soft, non-tender, non-distended MUSCULOSKELETAL:  No edema; No deformity  SKIN: Warm and dry LOWER EXTREMITIES: no swelling NEUROLOGIC:  Alert and oriented x 3 PSYCHIATRIC:  Normal affect   ASSESSMENT:    1. Nonrheumatic aortic valve insufficiency   2. Essential hypertension   3. Mixed hyperlipidemia   4. Dyslipidemia    PLAN:    In order of problems listed above:  1. Nonrheumatic arctic valve insufficiency overall he is asymptomatic therefore his C1, however I did notice enlargement of the ventricle on the echocardiogram.  Number from about a year ago diastolic was 20mm systolic 32mm, 6 months later diastolic 40mm systolic 63mm an echocardiogram this time showed diastolic 7 cm and systolic 4.6 cm clearly there is gradual increase in size of left ventricle.  Luckily, left ventricle ejection fraction still is about 55 to 60%.  Overall I think reaching the point that we start seeing evidence of failing ventricle going to consider aortic valve replacement.  I had a long discussion with him regarding that issue and I told him next step should be cardiac catheterization.  He agreed to proceed.  Therefore, I  explained cardiac catheterization to him including all risk benefits as well as alternatives and he is ready to proceed. 2. Essential hypertension blood pressure seems to be well controlled continue present management. 3. Dyslipidemia: I will schedule him to have fasting lipid profile done.   Medication Adjustments/Labs and Tests Ordered: Current medicines are reviewed at length with the patient today.  Concerns regarding medicines are outlined above.  No orders of the defined types were placed in this encounter.  Medication changes: No orders of the defined types were placed in this encounter.   Signed, Georgeanna Lea, MD, [facility] 02/26/2020 2:22 PM     [facility]

## 2020-02-26 NOTE — Patient Instructions (Addendum)
Medication Instructions:  Your physician has recommended you make the following change in your medication:  START: Lopressor 50 mg take one tablet by mouth daily.   *If you need a refill on your cardiac medications before your next appointment, please call your pharmacy*   Lab Work: Your physician recommends that you return for lab work in: TODAY CBC, BMP If you have labs (blood work) drawn today and your tests are completely normal, you will receive your results only by: Marland Kitchen MyChart Message (if you have MyChart) OR . A paper copy in the mail If you have any lab test that is abnormal or we need to change your treatment, we will call you to review the results.   Testing/Procedures:    [facility] CARDIOVASCULAR DIVISION [facility] [place] [address] [place] [address] Dept: [phone] Loc: [phone]  Wymon Swaney  02/26/2020  You are scheduled for a Cardiac Catheterization on Friday, February 11 with Dr. Tonny Bollman.   1. Please arrive at the [facility] (Main Entrance A) at [facility]: [address] [place], [place] [address] at 5:30 AM (This time is two hours before your procedure to ensure your preparation). Free valet parking service is available.   Special note: Every effort is made to have your procedure done on time. Please understand that emergencies sometimes delay scheduled procedures.  2. Diet: Do not eat solid foods after midnight.  The patient may have clear liquids until 5am upon the day of the procedure.  3. Labs: You will need to have blood drawn on 03/08/2020- 03/13/2020  4. Medication instructions in preparation for your procedure:   Contrast Allergy: No  On the morning of your procedure, take your Aspirin and any morning medicines NOT listed above.  You may use sips of water.  5. Plan for one night stay--bring personal belongings. 6. Bring a current list of your medications and current  insurance cards. 7. You MUST have a responsible person to drive you home. 8. Someone MUST be with you the first 24 hours after you arrive home or your discharge will be delayed. 9. Please wear clothes that are easy to get on and off and wear slip-on shoes.  Thank you for allowing [place] to care for you!   -- [facility]    Follow-Up: At [facility], you and your health needs are our priority.  As part of our continuing mission to provide you with exceptional heart care, we have created designated Provider Care Teams.  These Care Teams include your primary Cardiologist (physician) and Advanced Practice Providers (APPs -  Physician Assistants and Nurse Practitioners) who all work together to provide you with the care you need, when you need it.  We recommend signing up for the patient portal called "MyChart".  Sign up information is provided on this After Visit Summary.  MyChart is used to connect with patients for Virtual Visits (Telemedicine).  Patients are able to view lab/test results, encounter notes, upcoming appointments, etc.  Non-urgent messages can be sent to your provider as well.   To learn more about what you can do with MyChart, go to [email or web address].    Your next appointment:   2 month(s)  The format for your next appointment:   In Person  Provider:   Gypsy Balsam, MD   Other Instructions

## 2020-03-12 LAB — CBC
Hematocrit: 48.6 % (ref 37.5–51.0)
Hemoglobin: 16.8 g/dL (ref 13.0–17.7)
MCH: 31.6 pg (ref 26.6–33.0)
MCHC: 34.6 g/dL (ref 31.5–35.7)
MCV: 92 fL (ref 79–97)
Platelets: 221 10*3/uL (ref 150–450)
RBC: 5.31 x10E6/uL (ref 4.14–5.80)
RDW: 12.3 % (ref 11.6–15.4)
WBC: 6.7 10*3/uL (ref 3.4–10.8)

## 2020-03-12 LAB — BASIC METABOLIC PANEL
BUN/Creatinine Ratio: 11 (ref 9–20)
BUN: 11 mg/dL (ref 6–24)
CO2: 26 mmol/L (ref 20–29)
Calcium: 9.1 mg/dL (ref 8.7–10.2)
Chloride: 102 mmol/L (ref 96–106)
Creatinine, Ser: 0.96 mg/dL (ref 0.76–1.27)
GFR calc Af Amer: 102 mL/min/{1.73_m2} (ref >59)
GFR calc non Af Amer: 88 mL/min/{1.73_m2} (ref >59)
Glucose: 103 mg/dL — ABNORMAL HIGH (ref 65–99)
Potassium: 4.6 mmol/L (ref 3.5–5.2)
Sodium: 141 mmol/L (ref 134–144)

## 2020-03-13 ENCOUNTER — Other Ambulatory Visit (HOSPITAL_COMMUNITY)
Admission: RE | Admit: 2020-03-13 | Discharge: 2020-03-13 | Disposition: A | Discharge status: Home / Self Care | Payer: BC Managed Care – PPO | Source: Ambulatory Visit | Attending: Cardiovascular Disease | Admitting: Cardiovascular Disease

## 2020-03-13 DIAGNOSIS — Z20822 Contact with and (suspected) exposure to covid-19: Secondary | ICD-10-CM | POA: Insufficient documentation

## 2020-03-13 DIAGNOSIS — I2510 Atherosclerotic heart disease of native coronary artery without angina pectoris: Secondary | ICD-10-CM | POA: Diagnosis not present

## 2020-03-13 DIAGNOSIS — Z79899 Other long term (current) drug therapy: Secondary | ICD-10-CM | POA: Diagnosis not present

## 2020-03-13 DIAGNOSIS — Z87891 Personal history of nicotine dependence: Secondary | ICD-10-CM | POA: Diagnosis not present

## 2020-03-13 DIAGNOSIS — I10 Essential (primary) hypertension: Secondary | ICD-10-CM | POA: Diagnosis not present

## 2020-03-13 DIAGNOSIS — Z01812 Encounter for preprocedural laboratory examination: Secondary | ICD-10-CM | POA: Insufficient documentation

## 2020-03-13 DIAGNOSIS — Z888 Allergy status to other drugs, medicaments and biological substances status: Secondary | ICD-10-CM | POA: Diagnosis not present

## 2020-03-13 DIAGNOSIS — E782 Mixed hyperlipidemia: Secondary | ICD-10-CM | POA: Diagnosis not present

## 2020-03-13 DIAGNOSIS — Z880 Allergy status to penicillin: Secondary | ICD-10-CM | POA: Diagnosis not present

## 2020-03-13 DIAGNOSIS — I351 Nonrheumatic aortic (valve) insufficiency: Secondary | ICD-10-CM | POA: Diagnosis present

## 2020-03-13 LAB — SARS CORONAVIRUS 2 (TAT 6-24 HRS): SARS Coronavirus 2: NEGATIVE

## 2020-03-15 ENCOUNTER — Encounter (HOSPITAL_COMMUNITY)
Admission: RE | Disposition: A | Discharge status: Home / Self Care | Payer: Self-pay | Source: Home / Self Care | Attending: Cardiovascular Disease

## 2020-03-15 ENCOUNTER — Ambulatory Visit (HOSPITAL_COMMUNITY)
Admission: RE | Admit: 2020-03-15 | Discharge: 2020-03-15 | Disposition: A | Discharge status: Home / Self Care | Payer: BC Managed Care – PPO | Attending: Cardiovascular Disease | Admitting: Cardiovascular Disease

## 2020-03-15 ENCOUNTER — Encounter (HOSPITAL_COMMUNITY): Payer: Self-pay | Admitting: Cardiovascular Disease

## 2020-03-15 ENCOUNTER — Other Ambulatory Visit: Payer: Self-pay

## 2020-03-15 DIAGNOSIS — Z79899 Other long term (current) drug therapy: Secondary | ICD-10-CM | POA: Insufficient documentation

## 2020-03-15 DIAGNOSIS — Z888 Allergy status to other drugs, medicaments and biological substances status: Secondary | ICD-10-CM | POA: Insufficient documentation

## 2020-03-15 DIAGNOSIS — I351 Nonrheumatic aortic (valve) insufficiency: Secondary | ICD-10-CM | POA: Insufficient documentation

## 2020-03-15 DIAGNOSIS — Z880 Allergy status to penicillin: Secondary | ICD-10-CM | POA: Insufficient documentation

## 2020-03-15 DIAGNOSIS — E782 Mixed hyperlipidemia: Secondary | ICD-10-CM | POA: Diagnosis not present

## 2020-03-15 DIAGNOSIS — I10 Essential (primary) hypertension: Secondary | ICD-10-CM | POA: Diagnosis not present

## 2020-03-15 DIAGNOSIS — I2510 Atherosclerotic heart disease of native coronary artery without angina pectoris: Secondary | ICD-10-CM | POA: Insufficient documentation

## 2020-03-15 DIAGNOSIS — Z20822 Contact with and (suspected) exposure to covid-19: Secondary | ICD-10-CM | POA: Insufficient documentation

## 2020-03-15 DIAGNOSIS — Z87891 Personal history of nicotine dependence: Secondary | ICD-10-CM | POA: Insufficient documentation

## 2020-03-15 HISTORY — PX: RIGHT/LEFT HEART CATH AND CORONARY ANGIOGRAPHY: CATH118266

## 2020-03-15 LAB — POCT I-STAT 7, (LYTES, BLD GAS, ICA,H+H)
Acid-Base Excess: 0 mmol/L (ref 0.0–2.0)
Bicarbonate: 25.7 mmol/L (ref 20.0–28.0)
Calcium, Ion: 1.25 mmol/L (ref 1.15–1.40)
HCT: 49 % (ref 39.0–52.0)
Hemoglobin: 16.7 g/dL (ref 13.0–17.0)
O2 Saturation: 99 %
Potassium: 4.3 mmol/L (ref 3.5–5.1)
Sodium: 141 mmol/L (ref 135–145)
TCO2: 27 mmol/L (ref 22–32)
pCO2 arterial: 44.6 mmHg (ref 32.0–48.0)
pH, Arterial: 7.368 (ref 7.350–7.450)
pO2, Arterial: 138 mmHg — ABNORMAL HIGH (ref 83.0–108.0)

## 2020-03-15 LAB — POCT I-STAT EG7
Acid-base deficit: 1 mmol/L (ref 0.0–2.0)
Bicarbonate: 26 mmol/L (ref 20.0–28.0)
Calcium, Ion: 1.2 mmol/L (ref 1.15–1.40)
HCT: 47 % (ref 39.0–52.0)
Hemoglobin: 16 g/dL (ref 13.0–17.0)
O2 Saturation: 76 %
Potassium: 4 mmol/L (ref 3.5–5.1)
Sodium: 142 mmol/L (ref 135–145)
TCO2: 28 mmol/L (ref 22–32)
pCO2, Ven: 49 mmHg (ref 44.0–60.0)
pH, Ven: 7.333 (ref 7.250–7.430)
pO2, Ven: 44 mmHg (ref 32.0–45.0)

## 2020-03-15 SURGERY — RIGHT/LEFT HEART CATH AND CORONARY ANGIOGRAPHY
Anesthesia: LOCAL

## 2020-03-15 MED ORDER — MIDAZOLAM HCL 2 MG/2ML IJ SOLN
INTRAMUSCULAR | Status: AC
  Filled 2020-03-15: qty 2

## 2020-03-15 MED ORDER — ONDANSETRON HCL 4 MG/2ML IJ SOLN: 4.0000 mg | Freq: Four times a day (QID) | INTRAMUSCULAR | Status: DC | PRN

## 2020-03-15 MED ORDER — SODIUM CHLORIDE 0.9 % WEIGHT BASED INFUSION
3.0000 mL/kg/h | INTRAVENOUS | Status: AC
  Administered 2020-03-15: 3 mL/kg/h via INTRAVENOUS

## 2020-03-15 MED ORDER — ACETAMINOPHEN 325 MG PO TABS: 650.0000 mg | ORAL_TABLET | ORAL | Status: DC | PRN

## 2020-03-15 MED ORDER — LABETALOL HCL 5 MG/ML IV SOLN: 10.0000 mg | INTRAVENOUS | Status: DC | PRN

## 2020-03-15 MED ORDER — SODIUM CHLORIDE 0.9% FLUSH: 3.0000 mL | INTRAVENOUS | Status: DC | PRN

## 2020-03-15 MED ORDER — SODIUM CHLORIDE 0.9% FLUSH: 3.0000 mL | Freq: Two times a day (BID) | INTRAVENOUS | Status: DC

## 2020-03-15 MED ORDER — VERAPAMIL HCL 2.5 MG/ML IV SOLN
INTRAVENOUS | Status: AC
  Filled 2020-03-15: qty 2

## 2020-03-15 MED ORDER — HYDRALAZINE HCL 20 MG/ML IJ SOLN: 10.0000 mg | INTRAMUSCULAR | Status: DC | PRN

## 2020-03-15 MED ORDER — VERAPAMIL HCL 2.5 MG/ML IV SOLN
INTRAVENOUS | Status: DC | PRN
  Administered 2020-03-15: 10 mL via INTRA_ARTERIAL

## 2020-03-15 MED ORDER — LIDOCAINE HCL (PF) 1 % IJ SOLN
INTRAMUSCULAR | Status: AC
  Filled 2020-03-15: qty 30

## 2020-03-15 MED ORDER — SODIUM CHLORIDE 0.9 % WEIGHT BASED INFUSION: 1.0000 mL/kg/h | INTRAVENOUS | Status: DC

## 2020-03-15 MED ORDER — SODIUM CHLORIDE 0.9 % IV SOLN: INTRAVENOUS | Status: DC

## 2020-03-15 MED ORDER — SODIUM CHLORIDE 0.9 % IV SOLN: 250.0000 mL | INTRAVENOUS | Status: DC | PRN

## 2020-03-15 MED ORDER — ASPIRIN 81 MG PO CHEW
81.0000 mg | CHEWABLE_TABLET | ORAL | Status: AC
  Administered 2020-03-15: 81 mg via ORAL
  Filled 2020-03-15: qty 1

## 2020-03-15 MED ORDER — HEPARIN SODIUM (PORCINE) 1000 UNIT/ML IJ SOLN
INTRAMUSCULAR | Status: DC | PRN
  Administered 2020-03-15: 6000 [IU] via INTRAVENOUS
  Administered 2020-03-15: 2000 [IU] via INTRAVENOUS

## 2020-03-15 MED ORDER — IOHEXOL 350 MG/ML SOLN
INTRAVENOUS | Status: DC | PRN
  Administered 2020-03-15: 90 mL via INTRA_ARTERIAL

## 2020-03-15 MED ORDER — SODIUM CHLORIDE 0.9 % IV SOLN
250.0000 mL | INTRAVENOUS | Status: DC | PRN
Start: 2020-03-15 — End: 2020-03-15

## 2020-03-15 MED ORDER — FENTANYL CITRATE (PF) 100 MCG/2ML IJ SOLN
INTRAMUSCULAR | Status: AC
  Filled 2020-03-15: qty 2

## 2020-03-15 MED ORDER — LIDOCAINE HCL (PF) 1 % IJ SOLN
INTRAMUSCULAR | Status: DC | PRN
  Administered 2020-03-15: 2 mL via INTRADERMAL

## 2020-03-15 MED ORDER — HEPARIN (PORCINE) IN NACL 1000-0.9 UT/500ML-% IV SOLN
INTRAVENOUS | Status: DC | PRN
  Administered 2020-03-15 (×2): 500 mL

## 2020-03-15 MED ORDER — MIDAZOLAM HCL 2 MG/2ML IJ SOLN
INTRAMUSCULAR | Status: DC | PRN
  Administered 2020-03-15: 2 mg via INTRAVENOUS

## 2020-03-15 MED ORDER — FENTANYL CITRATE (PF) 100 MCG/2ML IJ SOLN
INTRAMUSCULAR | Status: DC | PRN
  Administered 2020-03-15: 25 ug via INTRAVENOUS

## 2020-03-15 MED ORDER — HEPARIN (PORCINE) IN NACL 1000-0.9 UT/500ML-% IV SOLN
INTRAVENOUS | Status: AC
  Filled 2020-03-15: qty 1000

## 2020-03-15 MED ORDER — HEPARIN (PORCINE) IN NACL 2000-0.9 UNIT/L-% IV SOLN
INTRAVENOUS | Status: AC
  Filled 2020-03-15: qty 1000

## 2020-03-15 SURGICAL SUPPLY — 18 items
BAG SNAP BAND KOVER 36X36 (MISCELLANEOUS) ×2 IMPLANT
CATH 5FR JL3.5 JR4 ANG PIG MP (CATHETERS) ×2 IMPLANT
CATH BALLN WEDGE 5F 110CM (CATHETERS) ×2 IMPLANT
COVER DOME SNAP 22 D (MISCELLANEOUS) ×2 IMPLANT
DEVICE RAD COMP TR BAND LRG (VASCULAR PRODUCTS) ×2 IMPLANT
GLIDESHEATH SLEND SS 6F .021 (SHEATH) ×2 IMPLANT
GUIDEWIRE .025 260CM (WIRE) ×2 IMPLANT
GUIDEWIRE INQWIRE 1.5J.035X260 (WIRE) ×1 IMPLANT
GUIDEWIRE SAF TJ AMPL .035X180 (WIRE) ×2 IMPLANT
INQWIRE 1.5J .035X260CM (WIRE) ×2
KIT HEART LEFT (KITS) ×2 IMPLANT
PACK CARDIAC CATHETERIZATION (CUSTOM PROCEDURE TRAY) ×2 IMPLANT
SHEATH 6FR 75 DEST SLENDER (SHEATH) ×2 IMPLANT
SHEATH GLIDE SLENDER 4/5FR (SHEATH) ×2 IMPLANT
SYR MEDRAD MARK 7 150ML (SYRINGE) ×2 IMPLANT
TRANSDUCER W/STOPCOCK (MISCELLANEOUS) ×2 IMPLANT
TUBING CIL FLEX 10 FLL-RA (TUBING) ×2 IMPLANT
TUBING CONTRAST HIGH PRESS 20 (MISCELLANEOUS) ×2 IMPLANT

## 2020-03-15 NOTE — H&P (Signed)
MD (Physician)                Cardiology Office Note:    Date:  02/26/2020   ID:  Jackson Roberts, DOB 1965-08-24, MRN [id number]  PCP:  Nonnie Done., MD   Cardiologist:  Gypsy Balsam, MD    Referring MD: Nonnie Done., MD       Chief Complaint  Patient presents with  . Results    Echo    History of Present Illness:    Jackson Roberts is a 55 y.o. 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  He comes today to my office discuss results of his echocardiogram.  Overall he is doing well.  Denies have any chest pain tightness squeezing pressure burning chest no dizziness he did not notice any decrease in exercise tolerance.  He exercised on the regular basis he can exercise for 30 minutes have no problem when asking if there is any changes in his physical ability within the last 6 months he answered absolutely not.  However, his echocardiogram being follow-up in 6 months interval showing gradual increase in size of the left ventricle.  He is here to talk about potentially getting ready for arctic valve replacement.      Past Medical History:  Diagnosis Date  . 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 Dizziness 01/17/2020  . Dyslipidemia 05/07/2015  . Essential hypertension 03/04/2017  . Mixed hyperlipidemia 03/04/2017  . Non-rheumatic mitral valve stenosis 05/07/2015   Mild in degree  Formatting of this note might be different from the original. Mild in degree  . Nonrheumatic aortic valve insufficiency 05/07/2015   Moderate in degree  . Plantar fasciitis of right foot 07/17/2015         Past Surgical History:  Procedure Laterality Date  . NO PAST SURGERIES      Current Medications:     Current Meds  Medication Sig  . albuterol (VENTOLIN HFA) 108 (90 Base) MCG/ACT inhaler Inhale 2 puffs into the lungs as needed.  . Ascorbic Acid (VITAMIN C) 100 MG tablet Take by  mouth.  . candesartan (ATACAND) 8 MG tablet Take 8 mg by mouth daily.  . Cholecalciferol (VITAMIN D3) 5000 units TABS Take by mouth.  . EPINEPHrine 0.3 mg/0.3 mL IJ SOAJ injection See admin instructions.  . lansoprazole (PREVACID) 15 MG capsule Take by mouth daily.  . metoprolol tartrate (LOPRESSOR) 25 MG tablet Take 1 tablet (25 mg total) by mouth 2 (two) times daily.  . Multiple Vitamin (MULTI-VITAMINS) TABS Take by mouth.  . pravastatin (PRAVACHOL) 10 MG tablet Take 5 mg by mouth daily.  . sildenafil (REVATIO) 20 MG tablet TAKE FIVE TABLETS DAILY AS NEEDED  . Testosterone 20.25 MG/ACT (1.62%) GEL SMARTSIG:2 Pump Topical Every Morning     Allergies:   Penicillin g and Prednisone   Social History        Socioeconomic History  . Marital status: Married    Spouse name: Not on file  . Number of children: Not on file  . Years of education: Not on file  . Highest education level: Not on file  Occupational History  . Not on file  Tobacco Use  . Smoking status: Former Smoker    Types: Cigarettes    Quit date: 09/11/2007    Years since quitting: 12.4  . Smokeless tobacco: Never Used  Substance and Sexual Activity  . Alcohol use: Yes    Alcohol/week: 1.0 standard drink  Types: 1 Cans of beer per week  . Drug use: Never  . Sexual activity: Not on file  Other Topics Concern  . Not on file  Social History Narrative  . Not on file   Social Determinants of Health   Financial Resource Strain: Not on file  Food Insecurity: Not on file  Transportation Needs: Not on file  Physical Activity: Not on file  Stress: Not on file  Social Connections: Not on file     Family History: The patient's family history includes Bone cancer in his maternal grandfather; Hypertension in his father and mother; Mitral valve prolapse in his mother; Stroke in his maternal grandfather. ROS:   Please see the history of present illness.    All 14 point review of systems negative except  as described per history of present illness  EKGs/Labs/Other Studies Reviewed:      Recent Labs: No results found for requested labs within last 8760 hours.  Recent Lipid Panel No results found for: CHOL, TRIG, HDL, CHOLHDL, VLDL, LDLCALC, LDLDIRECT  Physical Exam:    VS:  BP (!) 128/58 (BP Location: Left Arm, Patient Position: Sitting)   Pulse 81   Ht 6' (1.829 m)   Wt 283 lb (128.4 kg)   SpO2 94%   BMI 38.38 kg/m        Wt Readings from Last 3 Encounters:  02/26/20 283 lb (128.4 kg)  01/17/20 284 lb (128.8 kg)  11/02/19 283 lb (128.4 kg)     GEN:  Well nourished, well developed in no acute distress HEENT: Normal NECK: No JVD; No carotid bruits LYMPHATICS: No lymphadenopathy CARDIAC: RRR, holodiastolic murmur best heard left border of the sternum grade 2/6 to 3/6, no rubs, no gallops RESPIRATORY:  Clear to auscultation without rales, wheezing or rhonchi  ABDOMEN: Soft, non-tender, non-distended MUSCULOSKELETAL:  No edema; No deformity  SKIN: Warm and dry LOWER EXTREMITIES: no swelling NEUROLOGIC:  Alert and oriented x 3 PSYCHIATRIC:  Normal affect   ASSESSMENT:    1. Nonrheumatic aortic valve insufficiency   2. Essential hypertension   3. Mixed hyperlipidemia   4. Dyslipidemia    PLAN:    In order of problems listed above:  1. Nonrheumatic arctic valve insufficiency overall he is asymptomatic therefore his C1, however I did notice enlargement of the ventricle on the echocardiogram.  Number from about a year ago diastolic was 69mm systolic 62mm, 6 months later diastolic 65mm systolic 35mm an echocardiogram this time showed diastolic 7 cm and systolic 4.6 cm clearly there is gradual increase in size of left ventricle.  Luckily, left ventricle ejection fraction still is about 55 to 60%.  Overall I think reaching the point that we start seeing evidence of failing ventricle going to consider aortic valve replacement.  I had a long discussion with him  regarding that issue and I told him next step should be cardiac catheterization.  He agreed to proceed.  Therefore, I explained cardiac catheterization to him including all risk benefits as well as alternatives and he is ready to proceed. 2. Essential hypertension blood pressure seems to be well controlled continue present management. 3. Dyslipidemia: I will schedule him to have fasting lipid profile done.      ADDENDUM/INTERVAL H&P: See Dr Vanetta Shawl note above. Pt for R/L heart cath to evaluate treatment options for severe AI. Procedural risks, potential benefits, alternatives reviewed. No interval changes in symptoms.  Tonny Bollman 03/15/2020 7:37 AM

## 2020-03-15 NOTE — Research (Signed)
[place] Informed Consent   Subject Name: Jackson Roberts  Subject met inclusion and exclusion criteria.  The informed consent form, study requirements and expectations were reviewed with the subject and questions and concerns were addressed prior to the signing of the consent form.  The subject verbalized understanding of the trail requirements.  The subject agreed to participate in the [facility] trial and signed the informed consent.  The informed consent was obtained prior to performance of any protocol-specific procedures for the subject.  A copy of the signed informed consent was given to the subject and a copy was placed in the subject's medical record.  [name]. 03/15/2020, 6:45 am

## 2020-03-15 NOTE — Progress Notes (Addendum)
Arm board applied to right arm Ambulated to the bathroom and in th hallway  to void tol well

## 2020-03-15 NOTE — Discharge Instructions (Signed)
Radial Site Care  This sheet gives you information about how to care for yourself after your procedure. Your health care provider may also give you more specific instructions. If you have problems or questions, contact your health care provider. What can I expect after the procedure? After the procedure, it is common to have:  Bruising and tenderness at the catheter insertion area. Follow these instructions at home: Medicines  Take over-the-counter and prescription medicines only as told by your health care provider. Insertion site care 1. Follow instructions from your health care provider about how to take care of your insertion site. Make sure you: ? Wash your hands with soap and water before you remove your bandage (dressing). If soap and water are not available, use hand sanitizer. ? May remove dressing in 24 hours. 2. Check your insertion site every day for signs of infection. Check for: ? Redness, swelling, or pain. ? Fluid or blood. ? Pus or a bad smell. ? Warmth. 3. Do no take baths, swim, or use a hot tub for 5 days. 4. You may shower 24-48 hours after the procedure. ? Remove the dressing and gently wash the site with plain soap and water. ? Pat the area dry with a clean towel. ? Do not rub the site. That could cause bleeding. 5. Do not apply powder or lotion to the site. Activity  1. For 24 hours after the procedure, or as directed by your health care provider: ? Do not flex or bend the affected arm. ? Do not push or pull heavy objects with the affected arm. ? Do not drive yourself home from the hospital or clinic. You may drive 24 hours after the procedure. ? Do not operate machinery or power tools. ? KEEP ARM ELEVATED THE REMAINDER OF THE DAY. 2. Do not push, pull or lift anything that is heavier than 10 lb for 5 days. 3. Ask your health care provider when it is okay to: ? Return to work or school. ? Resume usual physical activities or sports. ? Resume sexual  activity. General instructions  If the catheter site starts to bleed, raise your arm and put firm pressure on the site. If the bleeding does not stop, get help right away. This is a medical emergency.  DRINK PLENTY OF FLUIDS FOR THE NEXT 2-3 DAYS.  No alcohol consumption for 24 hours after receiving sedation.  If you went home on the same day as your procedure, a responsible adult should be with you for the first 24 hours after you arrive home.  Keep all follow-up visits as told by your health care provider. This is important. Contact a health care provider if:  You have a fever.  You have redness, swelling, or yellow drainage around your insertion site. Get help right away if:  You have unusual pain at the radial site.  The catheter insertion area swells very fast.  The insertion area is bleeding, and the bleeding does not stop when you hold steady pressure on the area.  Your arm or hand becomes pale, cool, tingly, or numb. These symptoms may represent a serious problem that is an emergency. Do not wait to see if the symptoms will go away. Get medical help right away. Call your local emergency services (911 in the [place] Do not drive yourself to the hospital. Summary  After the procedure, it is common to have bruising and tenderness at the site.  Follow instructions from your health care provider about how to take care   of your radial site wound. Check the wound every day for signs of infection.  This information is not intended to replace advice given to you by your health care provider. Make sure you discuss any questions you have with your health care provider. Document Revised: 02/24/2017 Document Reviewed: 02/24/2017 Elsevier Patient Education  2020 [employer] 

## 2020-03-15 NOTE — Progress Notes (Signed)
Discharge instructions reviewed with pt and his wife (via telephone) Both voice understanding.  

## 2020-03-27 ENCOUNTER — Telehealth: Payer: Self-pay | Admitting: Cardiology

## 2020-03-27 ENCOUNTER — Telehealth: Payer: Self-pay

## 2020-03-27 DIAGNOSIS — R0602 Shortness of breath: Secondary | ICD-10-CM

## 2020-03-27 DIAGNOSIS — R5383 Other fatigue: Secondary | ICD-10-CM

## 2020-03-27 NOTE — Telephone Encounter (Signed)
New message:     Patient calling stating that he was calling to check the status

## 2020-03-27 NOTE — Telephone Encounter (Signed)
Referral sent to Dr.Owens team.

## 2020-03-28 NOTE — Telephone Encounter (Signed)
Called patient. He reports he got everything figured out that he needed to. No further needs, advised him to let us know if he needs anything further.

## 2020-04-15 ENCOUNTER — Other Ambulatory Visit: Payer: Self-pay | Admitting: *Deleted

## 2020-04-15 ENCOUNTER — Encounter: Payer: Self-pay | Admitting: Thoracic Surgery (Cardiothoracic Vascular Surgery)

## 2020-04-15 ENCOUNTER — Institutional Professional Consult (permissible substitution) (INDEPENDENT_AMBULATORY_CARE_PROVIDER_SITE_OTHER): Payer: BC Managed Care – PPO | Admitting: Thoracic Surgery (Cardiothoracic Vascular Surgery)

## 2020-04-15 ENCOUNTER — Other Ambulatory Visit: Payer: Self-pay

## 2020-04-15 VITALS — BP 144/71 | HR 70 | Resp 20 | Ht 72.0 in | Wt 284.0 lb

## 2020-04-15 DIAGNOSIS — I351 Nonrheumatic aortic (valve) insufficiency: Secondary | ICD-10-CM

## 2020-04-15 DIAGNOSIS — Z01818 Encounter for other preprocedural examination: Secondary | ICD-10-CM

## 2020-04-15 DIAGNOSIS — R42 Dizziness and giddiness: Secondary | ICD-10-CM

## 2020-04-15 NOTE — Patient Instructions (Addendum)
Continue all previous medications without any changes at this time  Schedule an appointment with your dentist ASAP for routine exam and cleaning  Endocarditis is a potentially serious infection of heart valves or inside lining of the heart.  It occurs more commonly in patients with diseased heart valves (such as patient's with aortic or mitral valve disease) and in patients who have undergone heart valve repair or replacement.  Certain surgical and dental procedures may put you at risk, such as dental cleaning, other dental procedures, or any surgery involving the respiratory, urinary, gastrointestinal tract, gallbladder or prostate gland.   To minimize your chances for develooping endocarditis, maintain good oral health and seek prompt medical attention for any infections involving the mouth, teeth, gums, skin or urinary tract.    Always notify your doctor or dentist about your underlying heart valve condition before having any invasive procedures. You will need to take antibiotics before certain procedures, including all routine dental cleanings or other dental procedures.  Your cardiologist or dentist should prescribe these antibiotics for you to be taken ahead of time.

## 2020-04-15 NOTE — H&P (View-Only) (Signed)
[facility]  MULTIDISCIPLINARY HEART VALVE CLINIC  CARDIOTHORACIC SURGERY CONSULTATION REPORT  Referring Provider is Krasowski, Robert J, MD PCP is Slatosky, John J., MD  Chief Complaint  Patient presents with  . Aortic Insuffiency    Initial surgical consult, ECHO 1/20, cath 2/11    HPI:  Patient is 55-year-old moderately obese male with history of hypertension, hyperlipidemia, and obstructive sleep apnea on CPAP who has been referred for surgical consultation to discuss treatment options for management of severe aortic insufficiency.  Patient states that he was first noted to have a heart murmur approximately 5 or 6 years ago.  He underwent echocardiogram and stress testing at that time and was found to have moderate to severe aortic insufficiency with preserved left ventricular systolic function.  He has remained essentially asymptomatic and followed carefully ever since by Dr. Krasowski.  Recent follow-up echocardiogram revealed severe aortic insufficiency with preserved left ventricular systolic function but significant increase in left ventricular chamber size over the last 2 years.  The patient subsequently underwent diagnostic cardiac catheterization which confirmed the presence of severe aortic insufficiency and revealed mild nonobstructive coronary artery disease with normal right heart pressures.  Cardiothoracic surgical consultation was requested.  Patient is married and lives in [place] with his wife.  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  He lives a somewhat sedentary lifestyle although he and his wife [place].  He is able to take care of many chores including mowing a large amount of land during the growing season without significant limitation.  He does admit to longstanding history of poor energy and decreased exercise tolerance.  He admits that he gets short  of breath when walking at a brisk pace with his wife.  He states that he simply cannot keep up with her because of exertional shortness of breath.  He denies shortness of breath with ordinary activities and he has never had any resting shortness of breath, PND, orthopnea, or lower extremity edema.  He has never had any exertional chest pain or chest tightness.  He had a brief dizzy spell back in December but this has been attributed to dehydration.  Past Medical History:  Diagnosis Date  . 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 Dizziness 01/17/2020  . Dyslipidemia   . Essential hypertension   . Mixed hyperlipidemia   . Nonrheumatic aortic valve insufficiency   . Plantar fasciitis of right foot 07/17/2015    Past Surgical History:  Procedure Laterality Date  . NO PAST SURGERIES    . RIGHT/LEFT HEART CATH AND CORONARY ANGIOGRAPHY N/A 03/15/2020   Procedure: RIGHT/LEFT HEART CATH AND CORONARY ANGIOGRAPHY;  Surgeon: Cooper, Michael, MD;  Location: [facility];  Service: Cardiovascular;  Laterality: N/A;    Family History  Problem Relation Age of Onset  . Mitral valve prolapse Mother   . Hypertension Mother   . Hypertension Father   . Stroke Maternal Grandfather   . Bone cancer Maternal Grandfather     Social History   Socioeconomic History  . Marital status: Married    Spouse name: Not on file  . Number of children: Not on file  . Years of education: Not on file  . Highest education level: Not on file  Occupational History  . Not on file  Tobacco Use  . Smoking status: Former Smoker    Types: Cigarettes      Quit date: 09/11/2007    Years since quitting: 12.6  . Smokeless tobacco: Never Used  Substance and Sexual Activity  . Alcohol use: Yes    Alcohol/week: 1.0 standard drink    Types: 1 Cans of beer per week  . Drug use: Never  . Sexual activity: Not on file  Other Topics Concern  . Not on file  Social History Narrative  . Not on file   Social Determinants of  Health   Financial Resource Strain: Not on file  Food Insecurity: Not on file  Transportation Needs: Not on file  Physical Activity: Not on file  Stress: Not on file  Social Connections: Not on file  Intimate Partner Violence: Not on file    Current Outpatient Medications  Medication Sig Dispense Refill  . albuterol (VENTOLIN HFA) 108 (90 Base) MCG/ACT inhaler Inhale 2 puffs into the lungs every 6 (six) hours as needed for wheezing or shortness of breath.    . Ascorbic Acid (VITAMIN C) 1000 MG tablet Take 1,000 mg by mouth daily.    . candesartan (ATACAND) 8 MG tablet Take 8 mg by mouth daily.    . cetirizine-pseudoephedrine (ZYRTEC-D) 5-120 MG tablet Take 1 tablet by mouth 2 (two) times daily as needed for allergies.    . Cholecalciferol (VITAMIN D3) 5000 units TABS Take 5,000 Units by mouth daily.    . dextromethorphan-guaiFENesin (MUCINEX DM) 30-600 MG 12hr tablet Take 1 tablet by mouth 2 (two) times daily as needed (congestion).    . EPINEPHrine 0.3 mg/0.3 mL IJ SOAJ injection Inject 0.3 mg into the muscle as needed for anaphylaxis.    . ibuprofen (ADVIL) 200 MG tablet Take 800 mg by mouth every 8 (eight) hours as needed for headache or moderate pain.    . lansoprazole (PREVACID) 15 MG capsule Take 15 mg by mouth daily.    . metoprolol tartrate (LOPRESSOR) 50 MG tablet Take 1 tablet (50 mg total) by mouth daily. 90 tablet 3  . Multiple Vitamin (MULTI-VITAMINS) TABS Take 1 tablet by mouth daily.    . pravastatin (PRAVACHOL) 10 MG tablet Take 5 mg by mouth daily.    . sildenafil (REVATIO) 20 MG tablet Take 100 mg by mouth daily as needed (erectile dysfunction).  5  . Testosterone 20.25 MG/ACT (1.62%) GEL Apply 2 Pump topically daily.     No current facility-administered medications for this visit.    Allergies  Allergen Reactions  . Penicillin G Swelling      Review of Systems:   General:  normal appetite, decreased energy, no weight gain, no weight loss, no  fever  Cardiac:  no chest pain with exertion, no chest pain at rest, + SOB with more strenuous exertion, no resting SOB, no PND, no orthopnea, no palpitations, no arrhythmia, no atrial fibrillation, no LE edema, no dizzy spells, no syncope  Respiratory:  no shortness of breath, no home oxygen, no productive cough, no dry cough, no bronchitis, no wheezing, no hemoptysis, no asthma, no pain with inspiration or cough, + sleep apnea, + CPAP at night  GI:   no difficulty swallowing, + reflux, no frequent heartburn, no hiatal hernia, no abdominal pain, no constipation, no diarrhea, no hematochezia, no hematemesis, no melena  GU:   no dysuria,  no frequency, no urinary tract infection, no hematuria, no enlarged prostate, no kidney stones, no kidney disease  Vascular:  no pain suggestive of claudication, no pain in feet, no leg cramps, no varicose veins, no DVT, no non-healing foot   ulcer  Neuro:   no stroke, no TIA's, no seizures, no headaches, no temporary blindness one eye,  no slurred speech, no peripheral neuropathy, no chronic pain, no instability of gait, no memory/cognitive dysfunction  Musculoskeletal: no arthritis, no joint swelling, no myalgias, no difficulty walking, normal mobility   Skin:   no rash, no itching, no skin infections, no pressure sores or ulcerations  Psych:   no anxiety, no depression, no nervousness, no unusual recent stress  Eyes:   no blurry vision, no floaters, no recent vision changes, + wears glasses or contacts  ENT:   no hearing loss, no loose or painful teeth, no dentures, last saw dentist > 2 years ago  Hematologic:  no easy bruising, no abnormal bleeding, no clotting disorder, no frequent epistaxis  Endocrine:  no diabetes, does not check CBG's at home           Physical Exam:   BP (!) 144/71 (BP Location: Right Arm, Patient Position: Sitting)   Pulse 70   Resp 20   Ht 6' (1.829 m)   Wt 284 lb (128.8 kg)   SpO2 99% Comment: RA  BMI 38.52 kg/m    General:  Obese,  well-appearing  HEENT:  Unremarkable   Neck:   no JVD, no bruits, no adenopathy   Chest:   clear to auscultation, symmetrical breath sounds, no wheezes, no rhonchi   CV:   RRR, grade II/VI blowing diastolic murmur heard best at LSB  Abdomen:  soft, non-tender, no masses   Extremities:  warm, well-perfused, pulses palpabld, no LE edema  Rectal/GU  Deferred  Neuro:   Grossly non-focal and symmetrical throughout  Skin:   Clean and dry, no rashes, no breakdown   Diagnostic Tests:  ECHOCARDIOGRAM LIMITED REPORT       Patient Name:  Jackson Roberts Date of Exam: 02/22/2020  Medical Rec #: [id number]      Height:    72.0 in  Accession #:  [id number]     Weight:    284.0 lb  Date of Birth: 05/24/1965     BSA:     2.473 m  Patient Age:  54 years      BP:      150/62 mmHg  Patient Gender: M          HR:      83 bpm.  Exam Location: [place]   Procedure: 2D Echo and Intracardiac Opacification Agent   Indications:  Aortic valve disorder I35.9    History:    Patient has prior history of Echocardiogram examinations,  most         recent 11/27/2019.  Signs/Symptoms:Dizziness/Lightheadedness;         Risk Factors:Hypertension.    Sonographer:  James Reel  Referring Phys: ROBERT J KRASOWSKI   IMPRESSIONS    1. Left ventricular ejection fraction, by estimation, is 60 to 65%. The  left ventricle has normal function. The left ventricle has no regional  wall motion abnormalities. Left ventricular diastolic parameters are  consistent with Grade I diastolic  dysfunction (impaired relaxation).  2. The aortic valve is normal in structure. Aortic valve regurgitation is  moderate to severe. No aortic stenosis is present.  3. There is mild dilatation of the ascending aorta, measuring 38 mm.   FINDINGS  Left Ventricle: Left ventricular ejection fraction, by estimation, is 60   to 65%. The left ventricle has normal function. The left ventricle has no  regional wall motion abnormalities. Definity contrast agent was given   IV  to delineate the left ventricular  endocardial borders. The left ventricular internal cavity size was normal  in size. There is no left ventricular hypertrophy. Left ventricular  diastolic parameters are consistent with Grade I diastolic dysfunction  (impaired relaxation).   Right Ventricle: The right ventricular size is normal. No increase in  right ventricular wall thickness. Right ventricular systolic function is  normal. There is normal pulmonary artery systolic pressure. The tricuspid  regurgitant velocity is 2.05 m/s, and  with an assumed right atrial pressure of 3 mmHg, the estimated right  ventricular systolic pressure is 19.8 mmHg.   Left Atrium: Left atrial size was normal in size.   Right Atrium: Right atrial size was normal in size.   Pericardium: There is no evidence of pericardial effusion.   Mitral Valve: The mitral valve is normal in structure. Trivial mitral  valve regurgitation. No evidence of mitral valve stenosis.   Tricuspid Valve: The tricuspid valve is normal in structure. Tricuspid  valve regurgitation is not demonstrated. No evidence of tricuspid  stenosis.   Aortic Valve: The aortic valve is normal in structure. Aortic valve  regurgitation is moderate to severe. Aortic regurgitation PHT measures 333  msec. No aortic stenosis is present.   Pulmonic Valve: The pulmonic valve was normal in structure. Pulmonic valve  regurgitation is not visualized. No evidence of pulmonic stenosis.   Aorta: The aortic root is normal in size and structure. There is mild  dilatation of the ascending aorta, measuring 38 mm.   Venous: The inferior vena cava is normal in size with greater than 50%  respiratory variability, suggesting right atrial pressure of 3 mmHg.   IAS/Shunts: No atrial level shunt detected by color flow  Doppler.   LEFT VENTRICLE  PLAX 2D  LVIDd:     7.00 cm   Diastology  LVIDs:     4.60 cm   LV e' medial:  6.31 cm/s  LV PW:     1.00 cm   LV E/e' medial: 15.4  LV IVS:    1.10 cm   LV e' lateral:  7.72 cm/s  LVOT diam:   2.70 cm   LV E/e' lateral: 12.6  LV SV:     164  LV SV Index:  66  LVOT Area:   5.73 cm    LV Volumes (MOD)  LV vol d, MOD A4C: 150.0 ml  LV vol s, MOD A4C: 61.0 ml  LV SV MOD A4C:   150.0 ml   RIGHT VENTRICLE       IVC  RV S prime:   17.00 cm/s IVC diam: 1.30 cm  TAPSE (M-mode): 2.9 cm   LEFT ATRIUM       Index    RIGHT ATRIUM      Index  LA diam:    4.00 cm 1.62 cm/m RA Area:   13.40 cm  LA Vol (A2C):  69.0 ml 27.90 ml/m RA Volume:  26.70 ml 10.80 ml/m  LA Vol (A4C):  42.3 ml 17.11 ml/m  LA Biplane Vol: 55.0 ml 22.24 ml/m  AORTIC VALVE  LVOT Vmax:  128.00 cm/s  LVOT Vmean: 87.700 cm/s  LVOT VTI:  0.286 m  AI PHT:   333 msec    AORTA  Ao Root diam: 3.70 cm  Ao Asc diam: 3.80 cm  Ao Desc diam: 2.40 cm   MITRAL VALVE        TRICUSPID VALVE  MV Area (PHT): 4.21 cm   TR Peak grad:    16.8 mmHg  MV Decel Time: 180 msec   TR Vmax:    205.00 cm/s  MV E velocity: 96.90 cm/s  MV A velocity: 104.00 cm/s SHUNTS  MV E/A ratio: 0.93     Systemic VTI: 0.29 m               Systemic Diam: 2.70 cm   Rajan Revankar MD  Electronically signed by Rajan Revankar MD  Signature Date/Time: 02/22/2020/2:52:21 PM       RIGHT/LEFT HEART CATH AND CORONARY ANGIOGRAPHY    Conclusion    There is no aortic valve stenosis. There is severe (4+) aortic regurgitation.   1.  Patent coronary arteries with mild diffuse nonobstructive CAD as outlined, no significant stenoses are present 2.  Severe aortic valve insufficiency based on aortic root angiography 3.  Essentially normal right heart hemodynamics   Indications  Severe aortic  insufficiency [I35.1 (ICD-10-CM)]   Procedural Details  Technical Details INDICATION: Severe aortic insufficiency with progressive LV dilatation, preoperative study  PROCEDURAL DETAILS: The right wrist is prepped, draped, and anesthetized with 1% lidocaine. Using the modified Seldinger technique, a 5/6 French Slender sheath is introduced into the right radial artery. 3 mg of verapamil is administered through the sheath, weight-based unfractionated heparin was administered intravenously.  There is marked right subclavian and innominate tortuosity.  This necessitates use of a 75 cm Terumo sheath.  Additional heparin is administered.  Standard Judkins catheters are used for selective coronary angiography. LV pressure is recorded and an aortic valve pullback gradient is measured.  Aortic root angiography is performed to assess severity of aortic valve insufficiency.  Catheter exchanges are performed over an exchange length guidewire. There are no immediate procedural complications. A TR band is used for radial hemostasis at the completion of the procedure.  The patient was transferred to the post catheterization recovery area for further monitoring.    Estimated blood loss <50 mL.   During this procedure medications were administered to achieve and maintain moderate conscious sedation while the patient's heart rate, blood pressure, and oxygen saturation were continuously monitored and I was present face-to-face 100% of this time.   Medications (Filter: Administrations occurring from 0727 to 0833 on 03/15/20) (important) Continuous medications are totaled by the amount administered until 03/15/20 0833.    fentaNYL (SUBLIMAZE) injection (mcg) Total dose:  25 mcg  Date/Time Rate/Dose/Volume Action   03/15/20 0733 25 mcg Given    midazolam (VERSED) injection (mg) Total dose:  2 mg  Date/Time Rate/Dose/Volume Action   03/15/20 0733 2 mg Given    Heparin (Porcine) in NaCl 1000-0.9 UT/500ML-%  SOLN (mL) Total volume:  1,000 mL  Date/Time Rate/Dose/Volume Action   03/15/20 0734 500 mL Given   0734 500 mL Given    lidocaine (PF) (XYLOCAINE) 1 % injection (mL) Total volume:  2 mL  Date/Time Rate/Dose/Volume Action   03/15/20 0749 2 mL Given    Radial Cocktail/Verapamil only (mL) Total volume:  10 mL  Date/Time Rate/Dose/Volume Action   03/15/20 0757 10 mL Given    heparin sodium (porcine) injection (Units) Total dose:  8,000 Units  Date/Time Rate/Dose/Volume Action   03/15/20 0759 6,000 Units Given   0810 2,000 Units Given    iohexol (OMNIPAQUE) 350 MG/ML injection (mL) Total volume:  90 mL  Date/Time Rate/Dose/Volume Action   03/15/20 0829 90 mL Given    Sedation Time  Sedation Time Physician-1: 54 minutes 23 seconds   Contrast  Medication Name Total Dose  iohexol (OMNIPAQUE) 350   MG/ML injection 90 mL    Radiation/Fluoro  Fluoro time: 10.7 (min) DAP: 33979 (mGycm2) Cumulative Air Kerma: 567 (mGy)   Coronary Findings   Diagnostic Dominance: Right  Left Main  Widely patent vessel without stenosis. Divides into the LAD and circumflex.  Left Anterior Descending  Vessel was injected. Vessel is large. The vessel exhibits minimal luminal irregularities. Patent vessel to the apex of the heart, patent first diagonal branch with no stenosis, mild irregularities throughout.  Left Circumflex  The circumflex is a very large vessel supplying a large obtuse marginal that divides into multiple vessels and supplies much of the lateral wall. There is no stenosis throughout the circumflex distribution.  Right Coronary Artery  Vessel is large. There is mild diffuse disease throughout the vessel. Dominant vessel, relatively smooth with areas of mild tapering in the mid vessel, no more than 30% stenosis.   Intervention   No interventions have been documented.  Left Heart  Aortic Valve There is no aortic valve stenosis. There is severe (4+) aortic  regurgitation. No calcification found in the aortic valve.   Coronary Diagrams   Diagnostic Dominance: Right    Intervention    Implants    No implant documentation for this case.    Syngo Images  Show images for CARDIAC CATHETERIZATION  Images on Long Term Storage  Show images for Lomeli, Trayshawn  Link to Procedure Log  Procedure Log     Hemo Data  Flowsheet Row Most Recent Value  Fick Cardiac Output 6.22 L/min  Fick Cardiac Output Index 2.53 (L/min)/BSA  RA A Wave 7 mmHg  RA V Wave 5 mmHg  RA Mean 7 mmHg  RV Systolic Pressure 27 mmHg  RV Diastolic Pressure 2 mmHg  RV EDP 7 mmHg  PA Systolic Pressure 26 mmHg  PA Diastolic Pressure 16 mmHg  PA Mean 21 mmHg  PW A Wave 21 mmHg  PW V Wave 23 mmHg  PW Mean 17 mmHg  AO Systolic Pressure 121 mmHg  AO Diastolic Pressure 63 mmHg  AO Mean 92 mmHg  LV Systolic Pressure 115 mmHg  LV Diastolic Pressure 12 mmHg  LV EDP 16 mmHg  LVp Systolic Pressure 118 mmHg  LVp Diastolic Pressure 10 mmHg  LVp EDP Pressure 16 mmHg  QP/QS 1  TPVR Index 8.3 HRUI  TSVR Index 36.35 HRUI  PVR SVR Ratio 0.05  TPVR/TSVR Ratio 0.23     Impression:  Patient has at least stage C2 and possibly early stage D severe aortic insufficiency.  He admits to some degree of exertional shortness of breath with more strenuous physical exertion consistent with chronic diastolic congestive heart failure, New York Heart Association functional class I-II.  I have personally reviewed the patient's recent follow-up transthoracic echocardiogram which demonstrates severe aortic insufficiency with preserved left ventricular systolic function but significant left ventricular chamber enlargement in comparison with previous echocardiograms.  Left ventricular end-diastolic diameter now approaches 7 cm.  The aortic valve appears to be trileaflet although images are not good quality due to poor acoustic windows.  Diagnostic cardiac catheterization reveals  mild nonobstructive coronary artery disease and confirmed the presence of severe aortic insufficiency on aortic root angiography.  Right heart pressures were normal.   Plan:  The patient and his wife were counseled at length regarding treatment alternatives for management of severe aortic insufficiency including continued medical therapy versus proceeding with aortic valve replacement in the near future.  The natural history of aortic insufficiency was reviewed, as was long term prognosis   with medical therapy alone.  Surgical options were discussed at length including conventional surgical aortic valve replacement through either a full median sternotomy or using minimally invasive techniques.  Other alternatives including aortic valve repair and the Ross autograft procedure were discussed.  Discussion was held comparing the relative risks of mechanical valve replacement with need for lifelong anticoagulation versus use of a bioprosthetic tissue valve and the associated potential for late structural valve deterioration and failure.  The patient is interested in proceeding with elective surgery in the near future.  As the next step the patient will undergo transesophageal echocardiogram and cardiac gated CT angiogram of the heart to further evaluate the size and functional anatomy of the patient's aortic valve and aortic root with particular interest as to whether or not his valve might be repairable.  Patient will also undergo pulmonary function testing and return to our office for follow-up in approximately 2 weeks to discuss surgical options further.  In the meanwhile, the patient has been encouraged to schedule an appointment with his dentist for routine cleaning and follow-up.  All of his questions have been addressed.    I spent in excess of 90 minutes during the conduct of this office consultation and >50% of this time involved direct face-to-face encounter with the patient for counseling and/or  coordination of their care.      Nanna Ertle H. Nirvana Blanchett, MD 04/15/2020 9:48 AM   

## 2020-04-15 NOTE — Progress Notes (Signed)
[facility]  MULTIDISCIPLINARY HEART VALVE CLINIC  CARDIOTHORACIC SURGERY CONSULTATION REPORT  Referring Provider is Georgeanna LeaKrasowski, Robert J, MD PCP is Egbert GaribaldiSlatosky, Excell SeltzerJohn J., MD  Chief Complaint  Patient presents with  . Aortic Insuffiency    Initial surgical consult, ECHO 1/20, cath 2/11    HPI:  Patient is 55 year old moderately obese male with history of hypertension, hyperlipidemia, and obstructive sleep apnea on CPAP who has been referred for surgical consultation to discuss treatment options for management of severe aortic insufficiency.  Patient states that he was first noted to have a heart murmur approximately 5 or 6 years ago.  He underwent echocardiogram and stress testing at that time and was found to have moderate to severe aortic insufficiency with preserved left ventricular systolic function.  He has remained essentially asymptomatic and followed carefully ever since by Dr. Bing MatterKrasowski.  Recent follow-up echocardiogram revealed severe aortic insufficiency with preserved left ventricular systolic function but significant increase in left ventricular chamber size over the last 2 years.  The patient subsequently underwent diagnostic cardiac catheterization which confirmed the presence of severe aortic insufficiency and revealed mild nonobstructive coronary artery disease with normal right heart pressures.  Cardiothoracic surgical consultation was requested.  Patient is married and lives in [place] with his wife.  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  He lives a somewhat sedentary lifestyle although he and his wife [place].  He is able to take care of many chores including mowing a large amount of land during the growing season without significant limitation.  He does admit to longstanding history of poor energy and decreased exercise tolerance.  He admits that he gets short  of breath when walking at a brisk pace with his wife.  He states that he simply cannot keep up with her because of exertional shortness of breath.  He denies shortness of breath with ordinary activities and he has never had any resting shortness of breath, PND, orthopnea, or lower extremity edema.  He has never had any exertional chest pain or chest tightness.  He had a brief dizzy spell back in December but this has been attributed to dehydration.  Past Medical History:  Diagnosis Date  . 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 Dizziness 01/17/2020  . Dyslipidemia   . Essential hypertension   . Mixed hyperlipidemia   . Nonrheumatic aortic valve insufficiency   . Plantar fasciitis of right foot 07/17/2015    Past Surgical History:  Procedure Laterality Date  . NO PAST SURGERIES    . RIGHT/LEFT HEART CATH AND CORONARY ANGIOGRAPHY N/A 03/15/2020   Procedure: RIGHT/LEFT HEART CATH AND CORONARY ANGIOGRAPHY;  Surgeon: Tonny Bollmanooper, Michael, MD;  Location: [facility];  Service: Cardiovascular;  Laterality: N/A;    Family History  Problem Relation Age of Onset  . Mitral valve prolapse Mother   . Hypertension Mother   . Hypertension Father   . Stroke Maternal Grandfather   . Bone cancer Maternal Grandfather     Social History   Socioeconomic History  . Marital status: Married    Spouse name: Not on file  . Number of children: Not on file  . Years of education: Not on file  . Highest education level: Not on file  Occupational History  . Not on file  Tobacco Use  . Smoking status: Former Smoker    Types: Cigarettes  Quit date: 09/11/2007    Years since quitting: 12.6  . Smokeless tobacco: Never Used  Substance and Sexual Activity  . Alcohol use: Yes    Alcohol/week: 1.0 standard drink    Types: 1 Cans of beer per week  . Drug use: Never  . Sexual activity: Not on file  Other Topics Concern  . Not on file  Social History Narrative  . Not on file   Social Determinants of  Health   Financial Resource Strain: Not on file  Food Insecurity: Not on file  Transportation Needs: Not on file  Physical Activity: Not on file  Stress: Not on file  Social Connections: Not on file  Intimate Partner Violence: Not on file    Current Outpatient Medications  Medication Sig Dispense Refill  . albuterol (VENTOLIN HFA) 108 (90 Base) MCG/ACT inhaler Inhale 2 puffs into the lungs every 6 (six) hours as needed for wheezing or shortness of breath.    . Ascorbic Acid (VITAMIN C) 1000 MG tablet Take 1,000 mg by mouth daily.    . candesartan (ATACAND) 8 MG tablet Take 8 mg by mouth daily.    . cetirizine-pseudoephedrine (ZYRTEC-D) 5-120 MG tablet Take 1 tablet by mouth 2 (two) times daily as needed for allergies.    . Cholecalciferol (VITAMIN D3) 5000 units TABS Take 5,000 Units by mouth daily.    Marland Kitchen dextromethorphan-guaiFENesin (MUCINEX DM) 30-600 MG 12hr tablet Take 1 tablet by mouth 2 (two) times daily as needed (congestion).    Marland Kitchen EPINEPHrine 0.3 mg/0.3 mL IJ SOAJ injection Inject 0.3 mg into the muscle as needed for anaphylaxis.    Marland Kitchen ibuprofen (ADVIL) 200 MG tablet Take 800 mg by mouth every 8 (eight) hours as needed for headache or moderate pain.    Marland Kitchen lansoprazole (PREVACID) 15 MG capsule Take 15 mg by mouth daily.    . metoprolol tartrate (LOPRESSOR) 50 MG tablet Take 1 tablet (50 mg total) by mouth daily. 90 tablet 3  . Multiple Vitamin (MULTI-VITAMINS) TABS Take 1 tablet by mouth daily.    . pravastatin (PRAVACHOL) 10 MG tablet Take 5 mg by mouth daily.    . sildenafil (REVATIO) 20 MG tablet Take 100 mg by mouth daily as needed (erectile dysfunction).  5  . Testosterone 20.25 MG/ACT (1.62%) GEL Apply 2 Pump topically daily.     No current facility-administered medications for this visit.    Allergies  Allergen Reactions  . Penicillin G Swelling      Review of Systems:   General:  normal appetite, decreased energy, no weight gain, no weight loss, no  fever  Cardiac:  no chest pain with exertion, no chest pain at rest, + SOB with more strenuous exertion, no resting SOB, no PND, no orthopnea, no palpitations, no arrhythmia, no atrial fibrillation, no LE edema, no dizzy spells, no syncope  Respiratory:  no shortness of breath, no home oxygen, no productive cough, no dry cough, no bronchitis, no wheezing, no hemoptysis, no asthma, no pain with inspiration or cough, + sleep apnea, + CPAP at night  GI:   no difficulty swallowing, + reflux, no frequent heartburn, no hiatal hernia, no abdominal pain, no constipation, no diarrhea, no hematochezia, no hematemesis, no melena  GU:   no dysuria,  no frequency, no urinary tract infection, no hematuria, no enlarged prostate, no kidney stones, no kidney disease  Vascular:  no pain suggestive of claudication, no pain in feet, no leg cramps, no varicose veins, no DVT, no non-healing foot  ulcer  Neuro:   no stroke, no TIA's, no seizures, no headaches, no temporary blindness one eye,  no slurred speech, no peripheral neuropathy, no chronic pain, no instability of gait, no memory/cognitive dysfunction  Musculoskeletal: no arthritis, no joint swelling, no myalgias, no difficulty walking, normal mobility   Skin:   no rash, no itching, no skin infections, no pressure sores or ulcerations  Psych:   no anxiety, no depression, no nervousness, no unusual recent stress  Eyes:   no blurry vision, no floaters, no recent vision changes, + wears glasses or contacts  ENT:   no hearing loss, no loose or painful teeth, no dentures, last saw dentist > 2 years ago  Hematologic:  no easy bruising, no abnormal bleeding, no clotting disorder, no frequent epistaxis  Endocrine:  no diabetes, does not check CBG's at home           Physical Exam:   BP (!) 144/71 (BP Location: Right Arm, Patient Position: Sitting)   Pulse 70   Resp 20   Ht 6' (1.829 m)   Wt 284 lb (128.8 kg)   SpO2 99% Comment: RA  BMI 38.52 kg/m    General:  Obese,  well-appearing  HEENT:  Unremarkable   Neck:   no JVD, no bruits, no adenopathy   Chest:   clear to auscultation, symmetrical breath sounds, no wheezes, no rhonchi   CV:   RRR, grade II/VI blowing diastolic murmur heard best at LSB  Abdomen:  soft, non-tender, no masses   Extremities:  warm, well-perfused, pulses palpabld, no LE edema  Rectal/GU  Deferred  Neuro:   Grossly non-focal and symmetrical throughout  Skin:   Clean and dry, no rashes, no breakdown   Diagnostic Tests:  ECHOCARDIOGRAM LIMITED REPORT       Patient Name:  Jackson Roberts Date of Exam: 02/22/2020  Medical Rec #: [id number]      Height:    72.0 in  Accession #:  [id number]     Weight:    284.0 lb  Date of Birth: 1965-08-05     BSA:     2.473 m  Patient Age:  54 years      BP:      150/62 mmHg  Patient Gender: M          HR:      83 bpm.  Exam Location:    Procedure: 2D Echo and Intracardiac Opacification Agent   Indications:  Aortic valve disorder I35.9    History:    Patient has prior history of Echocardiogram examinations,  most         recent 11/27/2019.  Signs/Symptoms:Dizziness/Lightheadedness;         Risk Factors:Hypertension.    Sonographer:  Louie Boston  Referring Phys: Marveen Reeks KRASOWSKI   IMPRESSIONS    1. Left ventricular ejection fraction, by estimation, is 60 to 65%. The  left ventricle has normal function. The left ventricle has no regional  wall motion abnormalities. Left ventricular diastolic parameters are  consistent with Grade I diastolic  dysfunction (impaired relaxation).  2. The aortic valve is normal in structure. Aortic valve regurgitation is  moderate to severe. No aortic stenosis is present.  3. There is mild dilatation of the ascending aorta, measuring 38 mm.   FINDINGS  Left Ventricle: Left ventricular ejection fraction, by estimation, is 60   to 65%. The left ventricle has normal function. The left ventricle has no  regional wall motion abnormalities. Definity contrast agent was given  IV  to delineate the left ventricular  endocardial borders. The left ventricular internal cavity size was normal  in size. There is no left ventricular hypertrophy. Left ventricular  diastolic parameters are consistent with Grade I diastolic dysfunction  (impaired relaxation).   Right Ventricle: The right ventricular size is normal. No increase in  right ventricular wall thickness. Right ventricular systolic function is  normal. There is normal pulmonary artery systolic pressure. The tricuspid  regurgitant velocity is 2.05 m/s, and  with an assumed right atrial pressure of 3 mmHg, the estimated right  ventricular systolic pressure is 19.8 mmHg.   Left Atrium: Left atrial size was normal in size.   Right Atrium: Right atrial size was normal in size.   Pericardium: There is no evidence of pericardial effusion.   Mitral Valve: The mitral valve is normal in structure. Trivial mitral  valve regurgitation. No evidence of mitral valve stenosis.   Tricuspid Valve: The tricuspid valve is normal in structure. Tricuspid  valve regurgitation is not demonstrated. No evidence of tricuspid  stenosis.   Aortic Valve: The aortic valve is normal in structure. Aortic valve  regurgitation is moderate to severe. Aortic regurgitation PHT measures 333  msec. No aortic stenosis is present.   Pulmonic Valve: The pulmonic valve was normal in structure. Pulmonic valve  regurgitation is not visualized. No evidence of pulmonic stenosis.   Aorta: The aortic root is normal in size and structure. There is mild  dilatation of the ascending aorta, measuring 38 mm.   Venous: The inferior vena cava is normal in size with greater than 50%  respiratory variability, suggesting right atrial pressure of 3 mmHg.   IAS/Shunts: No atrial level shunt detected by color flow  Doppler.   LEFT VENTRICLE  PLAX 2D  LVIDd:     7.00 cm   Diastology  LVIDs:     4.60 cm   LV e' medial:  6.31 cm/s  LV PW:     1.00 cm   LV E/e' medial: 15.4  LV IVS:    1.10 cm   LV e' lateral:  7.72 cm/s  LVOT diam:   2.70 cm   LV E/e' lateral: 12.6  LV SV:     164  LV SV Index:  66  LVOT Area:   5.73 cm    LV Volumes (MOD)  LV vol d, MOD A4C: 150.0 ml  LV vol s, MOD A4C: 61.0 ml  LV SV MOD A4C:   150.0 ml   RIGHT VENTRICLE       IVC  RV S prime:   17.00 cm/s IVC diam: 1.30 cm  TAPSE (M-mode): 2.9 cm   LEFT ATRIUM       Index    RIGHT ATRIUM      Index  LA diam:    4.00 cm 1.62 cm/m RA Area:   13.40 cm  LA Vol (A2C):  69.0 ml 27.90 ml/m RA Volume:  26.70 ml 10.80 ml/m  LA Vol (A4C):  42.3 ml 17.11 ml/m  LA Biplane Vol: 55.0 ml 22.24 ml/m  AORTIC VALVE  LVOT Vmax:  128.00 cm/s  LVOT Vmean: 87.700 cm/s  LVOT VTI:  0.286 m  AI PHT:   333 msec    AORTA  Ao Root diam: 3.70 cm  Ao Asc diam: 3.80 cm  Ao Desc diam: 2.40 cm   MITRAL VALVE        TRICUSPID VALVE  MV Area (PHT): 4.21 cm   TR Peak grad:  16.8 mmHg  MV Decel Time: 180 msec   TR Vmax:    205.00 cm/s  MV E velocity: 96.90 cm/s  MV A velocity: 104.00 cm/s SHUNTS  MV E/A ratio: 0.93     Systemic VTI: 0.29 m               Systemic Diam: 2.70 cm   Belva Crome MD  Electronically signed by Belva Crome MD  Signature Date/Time: 02/22/2020/2:52:21 PM       RIGHT/LEFT HEART CATH AND CORONARY ANGIOGRAPHY    Conclusion    There is no aortic valve stenosis. There is severe (4+) aortic regurgitation.   1.  Patent coronary arteries with mild diffuse nonobstructive CAD as outlined, no significant stenoses are present 2.  Severe aortic valve insufficiency based on aortic root angiography 3.  Essentially normal right heart hemodynamics   Indications  Severe aortic  insufficiency [I35.1 (ICD-10-CM)]   Procedural Details  Technical Details INDICATION: Severe aortic insufficiency with progressive LV dilatation, preoperative study  PROCEDURAL DETAILS: The right wrist is prepped, draped, and anesthetized with 1% lidocaine. Using the modified Seldinger technique, a 5/6 French Slender sheath is introduced into the right radial artery. 3 mg of verapamil is administered through the sheath, weight-based unfractionated heparin was administered intravenously.  There is marked right subclavian and innominate tortuosity.  This necessitates use of a 75 cm Terumo sheath.  Additional heparin is administered.  Standard Judkins catheters are used for selective coronary angiography. LV pressure is recorded and an aortic valve pullback gradient is measured.  Aortic root angiography is performed to assess severity of aortic valve insufficiency.  Catheter exchanges are performed over an exchange length guidewire. There are no immediate procedural complications. A TR band is used for radial hemostasis at the completion of the procedure.  The patient was transferred to the post catheterization recovery area for further monitoring.    Estimated blood loss <50 mL.   During this procedure medications were administered to achieve and maintain moderate conscious sedation while the patient's heart rate, blood pressure, and oxygen saturation were continuously monitored and I was present face-to-face 100% of this time.   Medications (Filter: Administrations occurring from 0727 to 0833 on 03/15/20) (important) Continuous medications are totaled by the amount administered until 03/15/20 0833.    fentaNYL (SUBLIMAZE) injection (mcg) Total dose:  25 mcg  Date/Time Rate/Dose/Volume Action   03/15/20 0733 25 mcg Given    midazolam (VERSED) injection (mg) Total dose:  2 mg  Date/Time Rate/Dose/Volume Action   03/15/20 0733 2 mg Given    Heparin (Porcine) in NaCl 1000-0.9 UT/500ML-%  SOLN (mL) Total volume:  1,000 mL  Date/Time Rate/Dose/Volume Action   03/15/20 0734 500 mL Given   0734 500 mL Given    lidocaine (PF) (XYLOCAINE) 1 % injection (mL) Total volume:  2 mL  Date/Time Rate/Dose/Volume Action   03/15/20 0749 2 mL Given    Radial Cocktail/Verapamil only (mL) Total volume:  10 mL  Date/Time Rate/Dose/Volume Action   03/15/20 0757 10 mL Given    heparin sodium (porcine) injection (Units) Total dose:  8,000 Units  Date/Time Rate/Dose/Volume Action   03/15/20 0759 6,000 Units Given   0810 2,000 Units Given    iohexol (OMNIPAQUE) 350 MG/ML injection (mL) Total volume:  90 mL  Date/Time Rate/Dose/Volume Action   03/15/20 0829 90 mL Given    Sedation Time  Sedation Time Physician-1: 54 minutes 23 seconds   Contrast  Medication Name Total Dose  iohexol (OMNIPAQUE) 350  MG/ML injection 90 mL    Radiation/Fluoro  Fluoro time: 10.7 (min) DAP: 33979 (mGycm2) Cumulative Air Kerma: 567 (mGy)   Coronary Findings   Diagnostic Dominance: Right  Left Main  Widely patent vessel without stenosis. Divides into the LAD and circumflex.  Left Anterior Descending  Vessel was injected. Vessel is large. The vessel exhibits minimal luminal irregularities. Patent vessel to the apex of the heart, patent first diagonal branch with no stenosis, mild irregularities throughout.  Left Circumflex  The circumflex is a very large vessel supplying a large obtuse marginal that divides into multiple vessels and supplies much of the lateral wall. There is no stenosis throughout the circumflex distribution.  Right Coronary Artery  Vessel is large. There is mild diffuse disease throughout the vessel. Dominant vessel, relatively smooth with areas of mild tapering in the mid vessel, no more than 30% stenosis.   Intervention   No interventions have been documented.  Left Heart  Aortic Valve There is no aortic valve stenosis. There is severe (4+) aortic  regurgitation. No calcification found in the aortic valve.   Coronary Diagrams   Diagnostic Dominance: Right    Intervention    Implants    No implant documentation for this case.    Syngo Images  Show images for CARDIAC CATHETERIZATION  Images on Long Term Storage  Show images for Kaylee, Wombles to Procedure Log  Procedure Log     Hemo Data  Flowsheet Row Most Recent Value  Fick Cardiac Output 6.22 L/min  Fick Cardiac Output Index 2.53 (L/min)/BSA  RA A Wave 7 mmHg  RA V Wave 5 mmHg  RA Mean 7 mmHg  RV Systolic Pressure 27 mmHg  RV Diastolic Pressure 2 mmHg  RV EDP 7 mmHg  PA Systolic Pressure 26 mmHg  PA Diastolic Pressure 16 mmHg  PA Mean 21 mmHg  PW A Wave 21 mmHg  PW V Wave 23 mmHg  PW Mean 17 mmHg  AO Systolic Pressure 121 mmHg  AO Diastolic Pressure 63 mmHg  AO Mean 92 mmHg  LV Systolic Pressure 115 mmHg  LV Diastolic Pressure 12 mmHg  LV EDP 16 mmHg  LVp Systolic Pressure 118 mmHg  LVp Diastolic Pressure 10 mmHg  LVp EDP Pressure 16 mmHg  QP/QS 1  TPVR Index 8.3 HRUI  TSVR Index 36.35 HRUI  PVR SVR Ratio 0.05  TPVR/TSVR Ratio 0.23     Impression:  Patient has at least stage C2 and possibly early stage D severe aortic insufficiency.  He admits to some degree of exertional shortness of breath with more strenuous physical exertion consistent with chronic diastolic congestive heart failure, New York Heart Association functional class I-II.  I have personally reviewed the patient's recent follow-up transthoracic echocardiogram which demonstrates severe aortic insufficiency with preserved left ventricular systolic function but significant left ventricular chamber enlargement in comparison with previous echocardiograms.  Left ventricular end-diastolic diameter now approaches 7 cm.  The aortic valve appears to be trileaflet although images are not good quality due to poor acoustic windows.  Diagnostic cardiac catheterization reveals  mild nonobstructive coronary artery disease and confirmed the presence of severe aortic insufficiency on aortic root angiography.  Right heart pressures were normal.   Plan:  The patient and his wife were counseled at length regarding treatment alternatives for management of severe aortic insufficiency including continued medical therapy versus proceeding with aortic valve replacement in the near future.  The natural history of aortic insufficiency was reviewed, as was long term prognosis  with medical therapy alone.  Surgical options were discussed at length including conventional surgical aortic valve replacement through either a full median sternotomy or using minimally invasive techniques.  Other alternatives including aortic valve repair and the Ross autograft procedure were discussed.  Discussion was held comparing the relative risks of mechanical valve replacement with need for lifelong anticoagulation versus use of a bioprosthetic tissue valve and the associated potential for late structural valve deterioration and failure.  The patient is interested in proceeding with elective surgery in the near future.  As the next step the patient will undergo transesophageal echocardiogram and cardiac gated CT angiogram of the heart to further evaluate the size and functional anatomy of the patient's aortic valve and aortic root with particular interest as to whether or not his valve might be repairable.  Patient will also undergo pulmonary function testing and return to our office for follow-up in approximately 2 weeks to discuss surgical options further.  In the meanwhile, the patient has been encouraged to schedule an appointment with his dentist for routine cleaning and follow-up.  All of his questions have been addressed.    I spent in excess of 90 minutes during the conduct of this office consultation and >50% of this time involved direct face-to-face encounter with the patient for counseling and/or  coordination of their care.      Salvatore Decent. Cornelius Moras, MD 04/15/2020 9:48 AM

## 2020-04-16 NOTE — Progress Notes (Signed)
Have him booked for April 6. If there is a cancellation, will move him earlier to March 29.

## 2020-04-16 NOTE — Progress Notes (Signed)
I can do even earlier on March 29. Misty Stanley, could you please help me schedule?

## 2020-04-17 ENCOUNTER — Telehealth: Payer: Self-pay | Admitting: *Deleted

## 2020-04-17 ENCOUNTER — Encounter: Payer: Self-pay | Admitting: Thoracic Surgery (Cardiothoracic Vascular Surgery)

## 2020-04-17 DIAGNOSIS — I351 Nonrheumatic aortic (valve) insufficiency: Secondary | ICD-10-CM

## 2020-04-17 DIAGNOSIS — I10 Essential (primary) hypertension: Secondary | ICD-10-CM

## 2020-04-17 NOTE — Telephone Encounter (Signed)
Call placed to the patient to schedule his TEE with Dr. Royann Shivers. He has been advised this has been tentatively scheduled for 05/08/20. He has been advised that we will try and get him in on March 29th if a spot opens.

## 2020-04-22 ENCOUNTER — Other Ambulatory Visit (HOSPITAL_COMMUNITY)
Admission: RE | Admit: 2020-04-22 | Discharge: 2020-04-22 | Disposition: A | Discharge status: Home / Self Care | Payer: BC Managed Care – PPO | Source: Ambulatory Visit | Attending: Thoracic Surgery (Cardiothoracic Vascular Surgery) | Admitting: Thoracic Surgery (Cardiothoracic Vascular Surgery)

## 2020-04-22 DIAGNOSIS — Z20822 Contact with and (suspected) exposure to covid-19: Secondary | ICD-10-CM | POA: Insufficient documentation

## 2020-04-22 DIAGNOSIS — Z01812 Encounter for preprocedural laboratory examination: Secondary | ICD-10-CM | POA: Diagnosis present

## 2020-04-22 LAB — SARS CORONAVIRUS 2 (TAT 6-24 HRS): SARS Coronavirus 2: NEGATIVE

## 2020-04-24 ENCOUNTER — Telehealth: Payer: Self-pay | Admitting: Cardiology

## 2020-04-24 NOTE — Telephone Encounter (Signed)
Patient states was referred to a surgeon and sees him again 05/06/20. He would like to know if he needs to reschedule his appointment with Dr. Bing Matter.

## 2020-04-24 NOTE — Telephone Encounter (Signed)
Left message for patient to return call.

## 2020-04-25 ENCOUNTER — Other Ambulatory Visit: Payer: Self-pay

## 2020-04-25 ENCOUNTER — Ambulatory Visit (HOSPITAL_COMMUNITY)
Admission: RE | Admit: 2020-04-25 | Discharge: 2020-04-25 | Disposition: A | Discharge status: Home / Self Care | Payer: BC Managed Care – PPO | Source: Ambulatory Visit | Attending: Thoracic Surgery (Cardiothoracic Vascular Surgery) | Admitting: Thoracic Surgery (Cardiothoracic Vascular Surgery)

## 2020-04-25 DIAGNOSIS — R42 Dizziness and giddiness: Secondary | ICD-10-CM

## 2020-04-25 DIAGNOSIS — I351 Nonrheumatic aortic (valve) insufficiency: Secondary | ICD-10-CM

## 2020-04-25 DIAGNOSIS — Z01818 Encounter for other preprocedural examination: Secondary | ICD-10-CM | POA: Diagnosis present

## 2020-04-25 LAB — PULMONARY FUNCTION TEST
DL/VA % pred: 133 %
DL/VA: 5.73 ml/min/mmHg/L
DLCO unc % pred: 119 %
DLCO unc: 34.95 ml/min/mmHg
FEF 25-75 Post: 2.39 L/sec
FEF 25-75 Pre: 2.24 L/sec
FEF2575-%Change-Post: 6 %
FEF2575-%Pred-Post: 73 %
FEF2575-%Pred-Pre: 68 %
FEV1-%Change-Post: 2 %
FEV1-%Pred-Post: 81 %
FEV1-%Pred-Pre: 79 %
FEV1-Post: 3.17 L
FEV1-Pre: 3.09 L
FEV1FVC-%Change-Post: 3 %
FEV1FVC-%Pred-Pre: 96 %
FEV6-%Change-Post: 0 %
FEV6-%Pred-Post: 84 %
FEV6-%Pred-Pre: 85 %
FEV6-Post: 4.09 L
FEV6-Pre: 4.13 L
FEV6FVC-%Change-Post: 0 %
FEV6FVC-%Pred-Post: 103 %
FEV6FVC-%Pred-Pre: 102 %
FVC-%Change-Post: -1 %
FVC-%Pred-Post: 81 %
FVC-%Pred-Pre: 82 %
FVC-Post: 4.14 L
FVC-Pre: 4.19 L
Post FEV1/FVC ratio: 77 %
Post FEV6/FVC ratio: 99 %
Pre FEV1/FVC ratio: 74 %
Pre FEV6/FVC Ratio: 99 %
RV % pred: 106 %
RV: 2.35 L
TLC % pred: 92 %
TLC: 6.61 L

## 2020-04-25 MED ORDER — ALBUTEROL SULFATE (2.5 MG/3ML) 0.083% IN NEBU
2.5000 mg | INHALATION_SOLUTION | Freq: Once | RESPIRATORY_TRACT | Status: AC
  Administered 2020-04-25: 2.5 mg via RESPIRATORY_TRACT

## 2020-04-26 ENCOUNTER — Encounter: Payer: Self-pay | Admitting: *Deleted

## 2020-04-26 NOTE — Telephone Encounter (Signed)
Called patient. He wants a follow up with Dr. Bing Matter rescheduled since his original one was cancelled. Scheduled him for this.

## 2020-04-26 NOTE — Telephone Encounter (Signed)
Patient made aware of instructions for the TEE. They are being sent to MyChart as well per his request.  You are scheduled for a TEE on 05/08/20 with Dr. Royann Shivers.  Please arrive at the [facility] (Main Entrance A) at [facility]: [address] [place], [place] [address] at 11 am. (1 hour prior to procedure)  DIET: Nothing to eat or drink after midnight except a sip of water with medications (see medication instructions below)  Medication Instructions: Hold: nothing to hold  Labs:  Your provider would like for you to return on 05/06/20 to have the following labs drawn: CBC and BMET.  You may have this done at any [facility].   You will need to have the coronavirus test completed prior to your procedure. An appointment has been made at 11:15 am on 05/06/20. This is a Drive Up Visit at [address] [place], [place], [place] [address]. Please tell them that you are there for procedure testing. Stay in your car and someone will be with you shortly. Please make sure to have all other labs completed before this test because you will need to stay quarantined until your procedure.   You must have a responsible person to drive you home and stay in the waiting area during your procedure. Failure to do so could result in cancellation.  Bring your insurance cards.  *Special Note: Every effort is made to have your procedure done on time. Occasionally there are emergencies that occur at the hospital that may cause delays. Please be patient if a delay does occur.

## 2020-04-29 ENCOUNTER — Telehealth (HOSPITAL_COMMUNITY): Payer: Self-pay | Admitting: Emergency Medicine

## 2020-04-29 NOTE — Telephone Encounter (Signed)
Attempted to call patient regarding upcoming cardiac CT appointment. °Left message on voicemail with name and callback number °Akeria Hedstrom RN Navigator Cardiac Imaging °[facility] °[phone] Office °[phone] Cell ° °

## 2020-05-01 ENCOUNTER — Ambulatory Visit: Payer: BC Managed Care – PPO | Admitting: Cardiology

## 2020-05-01 ENCOUNTER — Encounter (HOSPITAL_COMMUNITY): Payer: Self-pay

## 2020-05-01 ENCOUNTER — Other Ambulatory Visit: Payer: Self-pay

## 2020-05-01 ENCOUNTER — Ambulatory Visit (HOSPITAL_COMMUNITY)
Admission: RE | Admit: 2020-05-01 | Discharge: 2020-05-01 | Disposition: A | Discharge status: Home / Self Care | Payer: BC Managed Care – PPO | Source: Ambulatory Visit | Attending: Thoracic Surgery (Cardiothoracic Vascular Surgery) | Admitting: Thoracic Surgery (Cardiothoracic Vascular Surgery)

## 2020-05-01 DIAGNOSIS — R42 Dizziness and giddiness: Secondary | ICD-10-CM

## 2020-05-01 DIAGNOSIS — Z01818 Encounter for other preprocedural examination: Secondary | ICD-10-CM | POA: Diagnosis present

## 2020-05-01 DIAGNOSIS — R911 Solitary pulmonary nodule: Secondary | ICD-10-CM

## 2020-05-01 DIAGNOSIS — I351 Nonrheumatic aortic (valve) insufficiency: Secondary | ICD-10-CM

## 2020-05-01 HISTORY — DX: Solitary pulmonary nodule: R91.1

## 2020-05-01 MED ORDER — IOHEXOL 350 MG/ML SOLN
100.0000 mL | Freq: Once | INTRAVENOUS | Status: AC | PRN
  Administered 2020-05-01: 100 mL via INTRAVENOUS

## 2020-05-06 ENCOUNTER — Other Ambulatory Visit (HOSPITAL_COMMUNITY)
Admission: RE | Admit: 2020-05-06 | Discharge: 2020-05-06 | Disposition: A | Discharge status: Home / Self Care | Payer: BC Managed Care – PPO | Source: Ambulatory Visit | Attending: Cardiovascular Disease | Admitting: Cardiovascular Disease

## 2020-05-06 ENCOUNTER — Encounter: Payer: BC Managed Care – PPO | Admitting: Thoracic Surgery (Cardiothoracic Vascular Surgery)

## 2020-05-06 DIAGNOSIS — Z6838 Body mass index (BMI) 38.0-38.9, adult: Secondary | ICD-10-CM | POA: Diagnosis not present

## 2020-05-06 DIAGNOSIS — I351 Nonrheumatic aortic (valve) insufficiency: Secondary | ICD-10-CM | POA: Diagnosis present

## 2020-05-06 DIAGNOSIS — E669 Obesity, unspecified: Secondary | ICD-10-CM | POA: Diagnosis not present

## 2020-05-06 DIAGNOSIS — I10 Essential (primary) hypertension: Secondary | ICD-10-CM | POA: Diagnosis not present

## 2020-05-06 DIAGNOSIS — Z87891 Personal history of nicotine dependence: Secondary | ICD-10-CM | POA: Diagnosis not present

## 2020-05-06 DIAGNOSIS — Z01812 Encounter for preprocedural laboratory examination: Secondary | ICD-10-CM | POA: Insufficient documentation

## 2020-05-06 DIAGNOSIS — Z20822 Contact with and (suspected) exposure to covid-19: Secondary | ICD-10-CM | POA: Insufficient documentation

## 2020-05-06 DIAGNOSIS — I2510 Atherosclerotic heart disease of native coronary artery without angina pectoris: Secondary | ICD-10-CM | POA: Diagnosis not present

## 2020-05-06 DIAGNOSIS — Z8249 Family history of ischemic heart disease and other diseases of the circulatory system: Secondary | ICD-10-CM | POA: Diagnosis not present

## 2020-05-06 DIAGNOSIS — Z79899 Other long term (current) drug therapy: Secondary | ICD-10-CM | POA: Diagnosis not present

## 2020-05-06 DIAGNOSIS — G4733 Obstructive sleep apnea (adult) (pediatric): Secondary | ICD-10-CM | POA: Diagnosis not present

## 2020-05-06 DIAGNOSIS — E782 Mixed hyperlipidemia: Secondary | ICD-10-CM | POA: Diagnosis not present

## 2020-05-06 LAB — SARS CORONAVIRUS 2 (TAT 6-24 HRS): SARS Coronavirus 2: NEGATIVE

## 2020-05-07 ENCOUNTER — Other Ambulatory Visit: Payer: Self-pay | Admitting: *Deleted

## 2020-05-07 DIAGNOSIS — I351 Nonrheumatic aortic (valve) insufficiency: Secondary | ICD-10-CM

## 2020-05-07 LAB — CBC
Hematocrit: 52.5 % — ABNORMAL HIGH (ref 37.5–51.0)
Hemoglobin: 18.1 g/dL — ABNORMAL HIGH (ref 13.0–17.7)
MCH: 31.5 pg (ref 26.6–33.0)
MCHC: 34.5 g/dL (ref 31.5–35.7)
MCV: 91 fL (ref 79–97)
Platelets: 204 10*3/uL (ref 150–450)
RBC: 5.75 x10E6/uL (ref 4.14–5.80)
RDW: 13.1 % (ref 11.6–15.4)
WBC: 9.4 10*3/uL (ref 3.4–10.8)

## 2020-05-07 LAB — BASIC METABOLIC PANEL
BUN/Creatinine Ratio: 12 (ref 9–20)
BUN: 11 mg/dL (ref 6–24)
CO2: 25 mmol/L (ref 20–29)
Calcium: 9.6 mg/dL (ref 8.7–10.2)
Chloride: 101 mmol/L (ref 96–106)
Creatinine, Ser: 0.94 mg/dL (ref 0.76–1.27)
Glucose: 74 mg/dL (ref 65–99)
Potassium: 4.8 mmol/L (ref 3.5–5.2)
Sodium: 142 mmol/L (ref 134–144)
eGFR: 95 mL/min/{1.73_m2} (ref >59)

## 2020-05-08 ENCOUNTER — Encounter (HOSPITAL_COMMUNITY): Payer: Self-pay | Admitting: Cardiovascular Disease

## 2020-05-08 ENCOUNTER — Ambulatory Visit (HOSPITAL_COMMUNITY): Payer: BC Managed Care – PPO | Admitting: Anesthesiology

## 2020-05-08 ENCOUNTER — Ambulatory Visit (HOSPITAL_BASED_OUTPATIENT_CLINIC_OR_DEPARTMENT_OTHER): Payer: BC Managed Care – PPO

## 2020-05-08 ENCOUNTER — Other Ambulatory Visit: Payer: Self-pay

## 2020-05-08 ENCOUNTER — Encounter (HOSPITAL_COMMUNITY)
Admission: RE | Disposition: A | Discharge status: Home / Self Care | Payer: BC Managed Care – PPO | Source: Home / Self Care | Attending: Cardiovascular Disease

## 2020-05-08 ENCOUNTER — Ambulatory Visit (HOSPITAL_COMMUNITY)
Admission: RE | Admit: 2020-05-08 | Discharge: 2020-05-08 | Disposition: A | Discharge status: Home / Self Care | Payer: BC Managed Care – PPO | Attending: Cardiovascular Disease | Admitting: Cardiovascular Disease

## 2020-05-08 DIAGNOSIS — I351 Nonrheumatic aortic (valve) insufficiency: Secondary | ICD-10-CM | POA: Diagnosis not present

## 2020-05-08 DIAGNOSIS — G4733 Obstructive sleep apnea (adult) (pediatric): Secondary | ICD-10-CM | POA: Insufficient documentation

## 2020-05-08 DIAGNOSIS — Z6838 Body mass index (BMI) 38.0-38.9, adult: Secondary | ICD-10-CM | POA: Insufficient documentation

## 2020-05-08 DIAGNOSIS — Z20822 Contact with and (suspected) exposure to covid-19: Secondary | ICD-10-CM | POA: Insufficient documentation

## 2020-05-08 DIAGNOSIS — E782 Mixed hyperlipidemia: Secondary | ICD-10-CM | POA: Insufficient documentation

## 2020-05-08 DIAGNOSIS — Z87891 Personal history of nicotine dependence: Secondary | ICD-10-CM | POA: Insufficient documentation

## 2020-05-08 DIAGNOSIS — E669 Obesity, unspecified: Secondary | ICD-10-CM | POA: Insufficient documentation

## 2020-05-08 DIAGNOSIS — I10 Essential (primary) hypertension: Secondary | ICD-10-CM | POA: Insufficient documentation

## 2020-05-08 DIAGNOSIS — I2510 Atherosclerotic heart disease of native coronary artery without angina pectoris: Secondary | ICD-10-CM | POA: Insufficient documentation

## 2020-05-08 DIAGNOSIS — Z8249 Family history of ischemic heart disease and other diseases of the circulatory system: Secondary | ICD-10-CM | POA: Insufficient documentation

## 2020-05-08 DIAGNOSIS — Z79899 Other long term (current) drug therapy: Secondary | ICD-10-CM | POA: Insufficient documentation

## 2020-05-08 HISTORY — DX: Emphysema, unspecified: J43.9

## 2020-05-08 HISTORY — DX: Gastro-esophageal reflux disease without esophagitis: K21.9

## 2020-05-08 HISTORY — DX: Sleep apnea, unspecified: G47.30

## 2020-05-08 HISTORY — PX: TEE WITHOUT CARDIOVERSION: SHX5443

## 2020-05-08 LAB — ECHO TEE
AV Vena cont: 0.5 cm
P 1/2 time: 230 msec
S' Lateral: 4.08 cm

## 2020-05-08 SURGERY — ECHOCARDIOGRAM, TRANSESOPHAGEAL
Anesthesia: Monitor Anesthesia Care

## 2020-05-08 MED ORDER — SODIUM CHLORIDE 0.9 % IV SOLN: INTRAVENOUS | Status: DC | PRN

## 2020-05-08 MED ORDER — LIDOCAINE 2% (20 MG/ML) 5 ML SYRINGE
INTRAMUSCULAR | Status: DC | PRN
  Administered 2020-05-08: 100 mg via INTRAVENOUS

## 2020-05-08 MED ORDER — PROPOFOL 10 MG/ML IV BOLUS
INTRAVENOUS | Status: DC | PRN
  Administered 2020-05-08 (×4): 20 mg via INTRAVENOUS

## 2020-05-08 MED ORDER — SODIUM CHLORIDE 0.9 % IV SOLN: INTRAVENOUS | Status: DC

## 2020-05-08 MED ORDER — PROPOFOL 500 MG/50ML IV EMUL
INTRAVENOUS | Status: DC | PRN
  Administered 2020-05-08: 125 ug/kg/min via INTRAVENOUS

## 2020-05-08 NOTE — Discharge Instructions (Signed)

## 2020-05-08 NOTE — Transfer of Care (Signed)
Immediate Anesthesia Transfer of Care Note  Patient: Jackson Roberts  Procedure(s) Performed: TRANSESOPHAGEAL ECHOCARDIOGRAM (TEE) (N/A )  Patient Location: Endoscopy Unit  Anesthesia Type:MAC  Level of Consciousness: awake, alert  and sedated  Airway & Oxygen Therapy: Patient connected to nasal cannula oxygen  Post-op Assessment: Post -op Vital signs reviewed and stable  Post vital signs: stable  Last Vitals:  Vitals Value Taken Time  BP    Temp    Pulse    Resp    SpO2      Last Pain:  Vitals:   05/08/20 1128  TempSrc: Oral  PainSc: 0-No pain         Complications: No complications documented.

## 2020-05-08 NOTE — Anesthesia Procedure Notes (Signed)
Procedure Name: MAC Date/Time: 05/08/2020 12:00 PM Performed by: Lavell Luster, CRNA Pre-anesthesia Checklist: Patient identified, Emergency Drugs available, Suction available, Patient being monitored and Timeout performed Patient Re-evaluated:Patient Re-evaluated prior to induction Oxygen Delivery Method: Nasal cannula Preoxygenation: Pre-oxygenation with 100% oxygen Induction Type: IV induction Placement Confirmation: CO2 detector

## 2020-05-08 NOTE — Anesthesia Postprocedure Evaluation (Signed)
Anesthesia Post Note  Patient: Jackson Roberts  Procedure(s) Performed: TRANSESOPHAGEAL ECHOCARDIOGRAM (TEE) (N/A )     Patient location during evaluation: PACU Anesthesia Type: MAC Level of consciousness: awake and alert Pain management: pain level controlled Vital Signs Assessment: post-procedure vital signs reviewed and stable Respiratory status: spontaneous breathing, nonlabored ventilation, respiratory function stable and patient connected to nasal cannula oxygen Cardiovascular status: stable and blood pressure returned to baseline Postop Assessment: no apparent nausea or vomiting Anesthetic complications: no   No complications documented.  Last Vitals:  Vitals:   05/08/20 1248 05/08/20 1255  BP: (!) 116/51 (!) 122/54  Pulse: 77 78  Resp: 13 16  Temp:    SpO2: 95% 97%    Last Pain:  Vitals:   05/08/20 1255  TempSrc:   PainSc: 0-No pain                 Faron Whitelock S

## 2020-05-08 NOTE — Anesthesia Preprocedure Evaluation (Signed)
Anesthesia Evaluation  Patient identified by MRN, date of birth, ID band Patient awake    Reviewed: Allergy & Precautions, NPO status , Patient's Chart, lab work & pertinent test results  Airway Mallampati: II  TM Distance: >3 FB Neck ROM: Full    Dental no notable dental hx.    Pulmonary neg pulmonary ROS, former smoker,    Pulmonary exam normal breath sounds clear to auscultation       Cardiovascular hypertension, + Valvular Problems/Murmurs AI  Rhythm:Regular Rate:Normal + Systolic murmurs    Neuro/Psych negative neurological ROS  negative psych ROS   GI/Hepatic negative GI ROS, Neg liver ROS,   Endo/Other  negative endocrine ROS  Renal/GU negative Renal ROS  negative genitourinary   Musculoskeletal negative musculoskeletal ROS (+)   Abdominal   Peds negative pediatric ROS (+)  Hematology negative hematology ROS (+)   Anesthesia Other Findings   Reproductive/Obstetrics negative OB ROS                             Anesthesia Physical Anesthesia Plan  ASA: III  Anesthesia Plan: MAC   Post-op Pain Management:    Induction: Intravenous  PONV Risk Score and Plan: 1 and Propofol infusion and Treatment may vary due to age or medical condition  Airway Management Planned: Simple Face Mask  Additional Equipment:   Intra-op Plan:   Post-operative Plan:   Informed Consent: I have reviewed the patients History and Physical, chart, labs and discussed the procedure including the risks, benefits and alternatives for the proposed anesthesia with the patient or authorized representative who has indicated his/her understanding and acceptance.     Dental advisory given  Plan Discussed with: CRNA and Surgeon  Anesthesia Plan Comments:         Anesthesia Quick Evaluation

## 2020-05-08 NOTE — Op Note (Signed)
INDICATIONS: aortic insufficiency  PROCEDURE:   Informed consent was obtained prior to the procedure. The risks, benefits and alternatives for the procedure were discussed and the patient comprehended these risks.  Risks include, but are not limited to, cough, sore throat, vomiting, nausea, somnolence, esophageal and stomach trauma or perforation, bleeding, low blood pressure, aspiration, pneumonia, infection, trauma to the teeth and death.    After a procedural time-out, the oropharynx was anesthetized with 20% benzocaine spray.   During this procedure the patient was administered IV propofol (Anesthesiology, Dr. Okey Dupre).  The transesophageal probe was inserted in the esophagus and stomach without difficulty and multiple views were obtained.  The patient was kept under observation until the patient left the procedure room.  The patient left the procedure room in stable condition.   Agitated microbubble saline contrast was not administered.  COMPLICATIONS:    There were no immediate complications.  FINDINGS:  Moderately dilated left ventricle, LVEF 65%. Maximum end-diastolic diameter is approx. 61 mm. 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 There is a broad area of mid-valve malcoaptation, all along the three commisures. There is torrential aortic insufficiency. There is no aortic stenosis. The aortic root is mildly dilated at 42 mm. 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 Normal aorta beyond the root, without coarctation, dilation or atherosclerosis.  RECOMMENDATIONS:     Aortic valve surgical replacement.  Time Spent Directly with the Patient:  30 minutes   Dorella Laster 05/08/2020, 12:34 PM

## 2020-05-08 NOTE — Progress Notes (Signed)
  Echocardiogram Echocardiogram Transesophageal has been performed.  Gerda Diss 05/08/2020, 12:49 PM

## 2020-05-08 NOTE — Interval H&P Note (Signed)
History and Physical Interval Note:  05/08/2020 11:36 AM  Jackson Roberts  has presented today for surgery, with the diagnosis of AORTIC VALVE INSUFFICIENCY.  The various methods of treatment have been discussed with the patient and family. After consideration of risks, benefits and other options for treatment, the patient has consented to  Procedure(s): TRANSESOPHAGEAL ECHOCARDIOGRAM (TEE) (N/A) as a surgical intervention.  The patient's history has been reviewed, patient examined, no change in status, stable for surgery.  I have reviewed the patient's chart and labs.  Questions were answered to the patient's satisfaction.     Dorsey Authement

## 2020-05-09 ENCOUNTER — Encounter (HOSPITAL_COMMUNITY): Payer: Self-pay | Admitting: Cardiovascular Disease

## 2020-05-13 ENCOUNTER — Ambulatory Visit: Payer: BC Managed Care – PPO | Admitting: Thoracic Surgery (Cardiothoracic Vascular Surgery)

## 2020-05-13 ENCOUNTER — Other Ambulatory Visit: Payer: Self-pay

## 2020-05-13 ENCOUNTER — Other Ambulatory Visit: Payer: Self-pay | Admitting: *Deleted

## 2020-05-13 ENCOUNTER — Encounter: Payer: Self-pay | Admitting: Thoracic Surgery (Cardiothoracic Vascular Surgery)

## 2020-05-13 ENCOUNTER — Encounter: Payer: Self-pay | Admitting: *Deleted

## 2020-05-13 VITALS — BP 121/66 | HR 78 | Temp 98.4°F | Resp 20 | Ht 70.0 in | Wt 274.4 lb

## 2020-05-13 DIAGNOSIS — R911 Solitary pulmonary nodule: Secondary | ICD-10-CM

## 2020-05-13 DIAGNOSIS — I351 Nonrheumatic aortic (valve) insufficiency: Secondary | ICD-10-CM | POA: Diagnosis not present

## 2020-05-13 NOTE — Patient Instructions (Signed)
   Continue taking all current medications without change through the day before surgery.  Make sure to bring all of your medications with you when you come for your Pre-Admission Testing appointment at [facility] Short-Stay Department.  Have nothing to eat or drink after midnight the night before surgery.  On the morning of surgery take only Prevacid with a sip of water.  At your appointment for Pre-Admission Testing at the [facility]-Stay Department you will be asked to sign permission forms for your upcoming surgery.  By definition your signature on these forms implies that you and/or your designee provide full informed consent for your planned surgical procedure(s), that alternative treatment options have been discussed, that you understand and accept any and all potential risks, and that you have some understanding of what to expect for your post-operative convalescence.  For elective aortic valve replacement potential operative risks include but are not limited to at least some risk of death, stroke or other neurologic complication, myocardial infarction, congestive heart failure, respiratory failure, renal failure, bleeding requiring transfusion and/or reexploration, arrhythmia, heart block or bradycardia requiring permanent pacemaker insertion, infection or other wound complications, pneumonia, pleural and/or pericardial effusion, pulmonary embolus, aortic dissection or other major vascular complication, or other immediate or delayed complications related to valve replacement including but not limited to prosthetic valve dysfunction, paravalvular leak, late structural valve deterioration and failure, thrombosis, embolization, or endocarditis.  Specific risks potentially related to the minimally-invasive approach include but are not limited to risk of conversion to full or partial sternotomy, aortic dissection or other major vascular complication,  unilateral acute lung injury or pulmonary edema, phrenic nerve dysfunction or paralysis, rib fracture, chronic pain, lung hernia, or lymphocele.  Please call to schedule a follow-up appointment in our office prior to surgery if you have any unresolved questions about your planned surgical procedure, the associated risks, alternative treatment options, and/or expectations for your post-operative recovery.

## 2020-05-13 NOTE — Progress Notes (Signed)
[address]       [place] [id number]             [phone]     CARDIOTHORACIC SURGERY OFFICE NOTE  Referring Provider is Georgeanna LeaKrasowski, Robert J, MD PCP is Nonnie DoneSlatosky, John J., MD   HPI:  Patient is 55 year old moderately obese male with history of hypertension, hyperlipidemia, and obstructive sleep apnea on CPAP who returns to the office today to further discuss treatment options for management of severe aortic insufficiency.  He was originally seen in consultation on April 15, 2020.  He underwent pulmonary function testing on April 25, 2020 and CT angiogram on May 01, 2020.  Finally, transesophageal echocardiogram was performed May 08, 2020, confirming the presence of severe aortic insufficiency with dilated left ventricle and preserved left ventricular systolic function.  Patient and his wife return to the office today to review the results of these tests and discuss treatment options further.  He reports no new problems or complaints.   Current Outpatient Medications  Medication Sig Dispense Refill  . acetaminophen (TYLENOL) 500 MG tablet Take 500-1,000 mg by mouth every 6 (six) hours as needed (pain).    Marland Kitchen. albuterol (VENTOLIN HFA) 108 (90 Base) MCG/ACT inhaler Inhale 2 puffs into the lungs every 6 (six) hours as needed for wheezing or shortness of breath.    . Ascorbic Acid (VITAMIN C) 1000 MG tablet Take 1,000 mg by mouth in the morning.    . candesartan (ATACAND) 8 MG tablet Take 8 mg by mouth in the morning.    . cetirizine-pseudoephedrine (ZYRTEC-D) 5-120 MG tablet Take 1 tablet by mouth 2 (two) times daily as needed for allergies.    . Cholecalciferol (VITAMIN D3) 5000 units TABS Take 5,000 Units by mouth in the morning.    Marland Kitchen. dextromethorphan-guaiFENesin (MUCINEX DM) 30-600 MG 12hr tablet Take 1 tablet by mouth 2 (two) times daily as needed (congestion).    Marland Kitchen. EPINEPHrine 0.3 mg/0.3 mL IJ SOAJ injection Inject 0.3 mg into the muscle as needed for  anaphylaxis.    Marland Kitchen. ibuprofen (ADVIL) 200 MG tablet Take 800 mg by mouth every 8 (eight) hours as needed for headache or moderate pain.    Marland Kitchen. lansoprazole (PREVACID) 15 MG capsule Take 15 mg by mouth daily.    . metoprolol tartrate (LOPRESSOR) 50 MG tablet Take 1 tablet (50 mg total) by mouth daily. (Patient taking differently: Take 50 mg by mouth in the morning.) 90 tablet 3  . Multiple Vitamin (MULTIVITAMIN WITH MINERALS) TABS tablet Take 1 tablet by mouth in the morning. Centrum for Men    . pravastatin (PRAVACHOL) 10 MG tablet Take 5 mg by mouth in the morning.    . psyllium (METAMUCIL SMOOTH TEXTURE) 28 % packet Take 1 packet by mouth in the morning.    . sildenafil (REVATIO) 20 MG tablet Take 20 mg by mouth daily as needed (erectile dysfunction).  5  . Testosterone 20.25 MG/ACT (1.62%) GEL Apply 2 Pump topically daily. APPLIED ON CHEST IN THE MORNING     No current facility-administered medications for this visit.      Physical Exam:   BP 121/66 (BP Location: Right Arm, Patient Position: Sitting, Cuff Size: Large)   Pulse 78   Temp 98.4 F (36.9 C) (Skin)   Resp 20   Ht 5\' 10"  (1.778 m)   Wt 274 lb 6.4 oz (124.5 kg)   SpO2 99% Comment: RA  BMI 39.37 kg/m   General:  Well-appearing  Chest:   Clear to auscultation  CV:   Regular rate and rhythm with prominent to and fro systolic and diastolic murmur  Incisions:  n/a  Abdomen:  Soft nontender  Extremities:  Warm and well-perfused  Diagnostic Tests:  TRANSESOPHOGEAL ECHO REPORT       Patient Name:  Jackson Roberts Date of Exam: 05/08/2020  Medical Rec #: [id number]      Height:    72.0 in  Accession #:  [id number]     Weight:    284.0 lb  Date of Birth: [birth year]     BSA:     2.473 m  Patient Age:  54 years      BP:      151/66 mmHg  Patient Gender: M          HR:      82 bpm.  Exam Location: Inpatient   Procedure: 3D Echo, Transesophageal Echo, Cardiac  Doppler and Color  Doppler   Indications:   Aortic insufficiency    History:     Patient has prior history of Echocardiogram examinations,  most          recent 02/22/2020. Risk Factors:Hypertension.    Sonographer:   Ross Ludwig RDCS (AE)  Referring Phys: [id number] CROIT[facility]U  Diagnosing Phys: Thurmon Fair MD   PROCEDURE: After discussion of the risks and benefits of a TEE, an  informed consent was obtained from the patient. The transesophogeal probe  was passed without difficulty through the esophogus of the patient.  Sedation performed by different physician.  The patient was monitored while under deep sedation. Anesthestetic  sedation was provided intravenously by Anesthesiology: 637.06mg  of  Propofol, 100mg  of Lidocaine. Image quality was good. The patient  developed no complications during the procedure.   IMPRESSIONS    1. Left ventricular ejection fraction, by estimation, is 60 to 65%. The  left ventricle has normal function. The left ventricle has no regional  wall motion abnormalities. The left ventricular internal cavity size was  moderately dilated. Left ventricular  diastolic parameters were normal.  2. Right ventricular systolic function is normal. The right ventricular  size is normal.  3. No left atrial/left atrial appendage thrombus was detected.  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 Trivial mitral valve regurgitation.  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  5. All three aortic cusps appear redundant and exhibit diastolic  prolapse. The aortic valve is tricuspid. Aortic valve regurgitation is  severe. No aortic stenosis is present. Aortic regurgitation PHT measures  230 msec.  6. There is borderline dilatation of the aortic root, measuring 39 mm.   FINDINGS  Left Ventricle: Left ventricular ejection fraction, by estimation, is 60  to 65%. The left ventricle has normal function. The left ventricle  has no  regional wall motion abnormalities. The left ventricular internal cavity  size was moderately dilated.  There is no left ventricular hypertrophy. Left ventricular diastolic  parameters were normal.   Right Ventricle: The right ventricular size is normal. No increase in  right ventricular wall thickness. Right ventricular systolic function is  normal.   Left Atrium: Left atrial size was normal in size. No left atrial/left  atrial appendage thrombus was detected.   Right Atrium: Right atrial size was normal in size.   Pericardium: There is no evidence of pericardial effusion.   Mitral Valve: The mitral valve is myxomatous. There is mild late systolic  prolapse of both leaflets of the  mitral valve. Trivial mitral valve  regurgitation.   Tricuspid Valve: The tricuspid valve is normal in structure. Tricuspid  valve regurgitation is trivial.   Aortic Valve: All three aortic cusps appear redundant and exhibit  diastolic prolapse. The aortic valve is tricuspid. Aortic valve  regurgitation is severe. Aortic regurgitation PHT measures 230 msec. No  aortic stenosis is present.   Pulmonic Valve: The pulmonic valve was normal in structure. Pulmonic valve  regurgitation is trivial.   Aorta: The aortic root, ascending aorta and aortic arch are all  structurally normal, with no evidence of dilitation or obstruction. There  is borderline dilatation of the aortic root, measuring 39 mm.   IAS/Shunts: There is redundancy of the interatrial septum. No atrial level  shunt detected by color flow Doppler.     LEFT VENTRICLE  PLAX 2D  LVIDd:     6.24 cm  LVIDs:     4.08 cm  LVOT diam:   2.72 cm  LVOT Area:   5.80 cm     AORTIC VALVE  AI PHT:      230 msec  AR Vena Contracta: 0.50 cm    AORTA  Ao Root diam: 3.90 cm  Ao STJ diam: 2.7 cm  Ao Asc diam: 3.50 cm  Ao Arch diam: 2.9 cm     SHUNTS  Systemic Diam: 2.72 cm   Rachelle Hora Croitoru MD   Electronically signed by Thurmon Fair MD  Signature Date/Time: 05/08/2020/5:48:28 PM      Cardiac TAVR CT  TECHNIQUE: The patient was scanned on a [employer]. A 120 kV retrospective scan was triggered in the descending thoracic aorta at 111 HU's. Gantry rotation speed was 250 msecs and collimation was .6 mm. No beta blockade or nitro were given. The 3D data set was reconstructed in 5% intervals of the R-R cycle. Systolic and diastolic phases were analyzed on a dedicated work station using MPR, MIP and VRT modes. The patient received 80 cc of contrast.  FINDINGS: Aortic Root:  Aortic valve: Trileaflet  Aortic valve calcium score: 0  Aortic annulus:  Diameter: 52mm x 30mm  Perimeter:  Area: 1060 mm^2  Calcifications: No calcifications  Coronary height: Min Left - 90mm, Max Left - 50mm; Min Right - 40mm  Sinotubular height: Left cusp - 50mm; Right cusp - 53mm; Noncoronary cusp - 42mm  LVOT (as measured 3 mm below the annulus):  Diameter: 79mm x 104mm  Area: 1110 mm^2  Calcifications: No calcifications  Aortic sinus width: Left cusp - 39mm; Right cusp - 81mm; Noncoronary cusp - 53mm  Sinotubular junction width: 55mm x 81mm  Cardiac:  Right atrium: Mild enlargement  Right ventricle: Normal size  Pulmonary arteries: Normal size  Pulmonary veins: Normal configuration  Left atrium: Mild enlargement  Left ventricle: Severe dilatation  Pericardium: Normal  Coronary arteries: Calcium score 138 (82nd percentile)  IMPRESSION: 1.  Trileaflet aortic valve, no calcifications  2. Aortic annulus measures 22mm x 38mm in diameter with perimeter and area 1060 mm^2. No annular calcifications  3.  Severe LV dilatation  4.  Coronary calcium score 138 (82nd percentile)   Electronically Signed   By: Epifanio Lesches MD   On: 05/01/2020 22:08    CT ANGIOGRAPHY CHEST, ABDOMEN AND  PELVIS  TECHNIQUE: Non-contrast CT of the chest was initially obtained.  Multidetector CT imaging through the chest, abdomen and pelvis was performed using the standard protocol during bolus administration of intravenous contrast. Multiplanar reconstructed images and MIPs were obtained  and reviewed to evaluate the vascular anatomy.  CONTRAST:  OMNIPAQUE IOHEXOL 350 MG/ML SOLN  COMPARISON:  No priors.  FINDINGS: CTA CHEST FINDINGS  Cardiovascular: Heart size is enlarged with left ventricular dilatation. There is no significant pericardial fluid, thickening or pericardial calcification. There is aortic atherosclerosis, as well as atherosclerosis of the great vessels of the mediastinum and the coronary arteries, including calcified atherosclerotic plaque in the left circumflex coronary artery. Thickening of the aortic valve cusps, with incidental imaging during diastole demonstrates apparent central area of malcoaptation.  Mediastinum/Lymph Nodes: No pathologically enlarged mediastinal or hilar lymph nodes. Esophagus is unremarkable in appearance. No axillary lymphadenopathy.  Lungs/Pleura: 6 mm right middle lobe pulmonary nodule (axial image 70 of series 5). No other larger more suspicious appearing pulmonary nodules or masses are noted. No acute consolidative airspace disease. No pleural effusions.  Musculoskeletal/Soft Tissues: There are no aggressive appearing lytic or blastic lesions noted in the visualized portions of the skeleton.  CTA ABDOMEN AND PELVIS FINDINGS  Hepatobiliary: Diffuse low attenuation throughout the hepatic parenchyma, indicative of hepatic steatosis. No discrete cystic or solid hepatic lesions. No intra or extrahepatic biliary ductal dilatation. Gallbladder is normal in appearance.  Pancreas: No pancreatic mass. No pancreatic ductal dilatation. No pancreatic or peripancreatic fluid collections or  inflammatory changes.  Spleen: Unremarkable.  Adrenals/Urinary Tract: Subcentimeter low-attenuation lesion in the lower pole of the right kidney and in the anterior aspect of the upper pole of the left kidney, too small to characterize, but statistically likely to represent tiny cysts. No definite suspicious renal lesions. No hydroureteronephrosis. Urinary bladder is unremarkable in appearance. Bilateral adrenal glands are normal in appearance.  Stomach/Bowel: Normal appearance of the stomach. No pathologic dilatation of small bowel or colon. Numerous colonic diverticulae are noted, without surrounding inflammatory changes to suggest an acute diverticulitis at this time. Normal appendix.  Vascular/Lymphatic: Vascular findings and measurements pertinent to potential TAVR procedure, as detailed below. No aneurysm or dissection noted in the abdominal or pelvic vasculature. No lymphadenopathy noted in the abdomen or pelvis.  Reproductive: Prostate gland and seminal vesicles are unremarkable in appearance.  Other: No significant volume of ascites.  No pneumoperitoneum.  Musculoskeletal: There are no aggressive appearing lytic or blastic lesions noted in the visualized portions of the skeleton.  VASCULAR MEASUREMENTS PERTINENT TO TAVR:  AORTA:  Minimal Aortic Diameter-21 x 19 mm  Severity of Aortic Calcification-none  RIGHT PELVIS:  Right Common Iliac Artery -  Minimal Diameter-12.1 x 9.6 mm  Tortuosity-moderate  Calcification-none  Right External Iliac Artery -  Minimal Diameter-10.0 x 9.7 mm  Tortuosity-moderate  Calcification-none  Right Common Femoral Artery -  Minimal Diameter-10.1 x 10.1 mm  Tortuosity-mild  Calcification-none  LEFT PELVIS:  Left Common Iliac Artery -  Minimal Diameter-11.0 x 10.4 mm  Tortuosity-moderate  Calcification-none  Left External Iliac Artery -  Minimal Diameter-9.6 x 9.3  mm  Tortuosity-moderate  Calcification-none  Left Common Femoral Artery -  Minimal Diameter-9.7 x 8.4 mm  Tortuosity-mild  Calcification-minimal  Review of the MIP images confirms the above findings.  IMPRESSION: 1. Vascular findings and measurements pertinent to potential TAVR procedure, as detailed above. 2. Severe thickening of the aortic valve with apparent central region of malcoaptation, compatible with reported clinical history of severe aortic insufficiency. 3. Cardiomegaly with left ventricular dilatation. 4. Aortic atherosclerosis, in addition to left circumflex coronary artery disease. Please note that although the presence of coronary artery calcium documents the presence of coronary artery disease, the severity of this disease  and any potential stenosis cannot be assessed on this non-gated CT examination. Assessment for potential risk factor modification, dietary therapy or pharmacologic therapy may be warranted, if clinically indicated. 5. 6 mm right middle lobe pulmonary nodule. Non-contrast chest CT at 6-12 months is recommended. If the nodule is stable at time of repeat CT, then future CT at 18-24 months (from today's scan) is considered optional for low-risk patients, but is recommended for high-risk patients. This recommendation follows the consensus statement: Guidelines for Management of Incidental Pulmonary Nodules Detected on CT Images: From the Fleischner Society 2017; Radiology 2017; 284:228-243. 6. Hepatic steatosis. 7. Colonic diverticulosis without evidence of acute diverticulitis at this time.   Electronically Signed   By: Trudie Reed M.D.   On: 05/02/2020 07:18    Pulmonary Function Tests  Baseline      Post-bronchodilator  FVC  4.19 L  (82% predicted) FVC  4.14 L  (81% predicted) FEV1  3.09 L  (79% predicted) FEV1  3.17 L  (81% predicted) FEF25-75 2.24 L  (68% predicted) FEF25-75 2.39 L  (73%  predicted)  TLC  6.61 L  (92% predicted) RV  2.35 L  (106% predicted) DLCO  119% predicted      Impression:  Patient has early stage D severe symptomatic aortic insufficiency.  He describes exertional shortness of breath that occurs with more strenuous physical exertion consistent with chronic diastolic congestive heart failure, New York Heart Association functional class I-II.  I have personally reviewed the patient's recent transthoracic and transesophageal echocardiograms, diagnostic cardiac catheterization, CT angiogram, and pulmonary function tests.  Echocardiograms confirmed the presence of severe aortic insufficiency with dilated left ventricle and preserved left ventricular systolic function.  On TEE the aortic valve was clearly trileaflet with redundant leaflet tissue, type II dysfunction with prolapse of all 3 segments, and a dilated annulus.  Aortic insufficiency was described as "torrential" with pressure half-time measured 230 ms.  There was no aortic stenosis.  Left ventricle was moderately dilated.  Left ventricular ejection fraction was estimated 60 to 65%.  Gated CT angiogram of the heart confirmed the presence of trileaflet aortic valve with findings consistent with severe aortic insufficiency.  There was no aneurysmal dilatation of the aortic root or proximal ascending aorta.  There are no contraindications to peripheral cannulation for surgery.  CT scan also revealed incidental pulmonary nodule in the right middle lobe which will require follow-up CT in 6 to 12 months.  Pulmonary function testing revealed only mild changes of COPD.    Plan:  The patient and his wife were again counseled at length regarding treatment alternatives for management of severe aortic insufficiency including continued medical therapy versus proceeding with aortic valve replacement in the near future.  The natural history of aortic insufficiency was reviewed, as was long term prognosis with medical  therapy alone.  Surgical options were discussed at length including conventional surgical aortic valve replacement through either a full median sternotomy or using minimally invasive techniques.  Based upon review of the patient's transesophageal echocardiogram I do not recommend an attempt at valve repair.  Discussion was held comparing the relative risks of mechanical valve replacement with need for lifelong anticoagulation versus use of a bioprosthetic tissue valve and the associated potential for late structural valve deterioration and failure.  This discussion was placed in the context of the patient's particular circumstances, and as a result the patient specifically requests that their valve be replaced using a bioprosthetic tissue valve.  The patient understands and accepts  all potential associated risks of surgery including but not limited to risk of death, stroke, myocardial infarction, congestive heart failure, respiratory failure, renal failure, pneumonia, bleeding requiring blood transfusion and or reexploration, arrhythmia, heart block or bradycardia requiring permanent pacemaker, aortic dissection or other major vascular complication, pleural effusions or other delayed complications related to continued congestive heart failure, and other late complications related to valve replacement including structural valve deterioration and failure, thrombosis, endocarditis, or paravalvular leak.  Alternative surgical approaches have been discussed including a comparison between conventional sternotomy and minimally-invasive techniques.  The relative risks and benefits of each have been reviewed as they pertain to the patient's specific circumstances, and all of their questions have been addressed.  Specific risks potentially related to the minimally-invasive approach were discussed at length, including but not limited to risk of conversion to full or partial sternotomy, aortic dissection or other major vascular  complication, unilateral acute lung injury or pulmonary edema, phrenic nerve dysfunction or paralysis, rib fracture, chronic pain, lung hernia, or lymphocele.   We tentatively plan to proceed with surgery on Jun 04, 2020.  The patient will return for follow-up prior to surgery on Jun 03, 2020.  All questions have been answered.    I spent in excess of 30 minutes during the conduct of this office consultation and >50% of this time involved direct face-to-face encounter with the patient for counseling and/or coordination of their care.    Salvatore Decent. Cornelius Moras, MD 05/13/2020 2:44 PM

## 2020-05-27 ENCOUNTER — Telehealth: Payer: Self-pay | Admitting: *Deleted

## 2020-05-27 ENCOUNTER — Ambulatory Visit: Payer: BC Managed Care – PPO | Admitting: Cardiology

## 2020-05-27 NOTE — Telephone Encounter (Signed)
Jackson Roberts contacted the office regarding a recent prednisone prescription sent in by patient's PCP. Per patient, he was seen by his PCP for allergies, upon examination he was noted to have a wheeze. Patient's physician ordered the patient a 6 day course of prednisone in prevention of bronchitis. Patient is scheduled for surgery with Dr. Cornelius Moras on Tuesday May 6th. Per Dr. Cornelius Moras, patient may continue taking the prednisone as prescribed by his PCP. Patient verbalizes understanding.

## 2020-05-30 NOTE — Progress Notes (Signed)
Surgical Instructions    Your procedure is scheduled on 06/04/20.  Report to [facility] Main Entrance "A" at 05:30 A.M., then check in with the Admitting office.  Call this number if you have problems the morning of surgery:  [phone]   If you have any questions prior to your surgery date call [phone]: Open Monday-Friday 8am-4pm    Remember:  Do not eat or drink after midnight the night before your surgery     Take these medicines the morning of surgery with A SIP OF WATER  acetaminophen (TYLENOL) if needed for pain albuterol (VENTOLIN HFA) if needed for shortness of breath cetirizine-pseudoephedrine (ZYRTEC-D) if needed dextromethorphan-guaiFENesin (MUCINEX DM)  If needed lansoprazole (PREVACID) metoprolol tartrate (LOPRESSOR) pravastatin (PRAVACHOL)     As of today, STOP taking any Aspirin (unless otherwise instructed by your surgeon) Aleve, Naproxen, Ibuprofen, Motrin, Advil, Goody's, BC's, all herbal medications, fish oil, and all vitamins.                     Do not wear jewelry, make up, or nail polish            Do not wear lotions, powders, perfumes/colognes, or deodorant.            Men may shave face and neck.            Do not bring valuables to the hospital.            [facility] is not responsible for any belongings or valuables.  Do NOT Smoke (Tobacco/Vaping) or drink Alcohol 24 hours prior to your procedure If you use a CPAP at night, you may bring all equipment for your overnight stay.   Contacts, glasses, dentures or bridgework may not be worn into surgery, please bring cases for these belongings   For patients admitted to the hospital, discharge time will be determined by your treatment team.   Patients discharged the day of surgery will not be allowed to drive home, and someone needs to stay with them for 24 hours.    Special instructions:   Deputy- Preparing For Surgery  Before surgery, you can play an important role. Because skin is  not sterile, your skin needs to be as free of germs as possible. You can reduce the number of germs on your skin by washing with CHG (chlorahexidine gluconate) Soap before surgery.  CHG is an antiseptic cleaner which kills germs and bonds with the skin to continue killing germs even after washing.    Oral Hygiene is also important to reduce your risk of infection.  Remember - BRUSH YOUR TEETH THE MORNING OF SURGERY WITH YOUR REGULAR TOOTHPASTE  Please do not use if you have an allergy to CHG or antibacterial soaps. If your skin becomes reddened/irritated stop using the CHG.  Do not shave (including legs and underarms) for at least 48 hours prior to first CHG shower. It is OK to shave your face.  Please follow these instructions carefully.   1. Shower the NIGHT BEFORE SURGERY and the MORNING OF SURGERY  2. If you chose to wash your hair, wash your hair first as usual with your normal shampoo.  3. After you shampoo, rinse your hair and body thoroughly to remove the shampoo.  4. Wash Face and genitals (private parts) with your normal soap.   5.  Shower the NIGHT BEFORE SURGERY and the MORNING OF SURGERY with CHG Soap.   6. Use CHG Soap as you would any  other liquid soap. You can apply CHG directly to the skin and wash gently with a scrungie or a clean washcloth.   7. Apply the CHG Soap to your body ONLY FROM THE NECK DOWN.  Do not use on open wounds or open sores. Avoid contact with your eyes, ears, mouth and genitals (private parts). Wash Face and genitals (private parts)  with your normal soap.   8. Wash thoroughly, paying special attention to the area where your surgery will be performed.  9. Thoroughly rinse your body with warm water from the neck down.  10. DO NOT shower/wash with your normal soap after using and rinsing off the CHG Soap.  11. Pat yourself dry with a CLEAN TOWEL.  12. Wear CLEAN PAJAMAS to bed the night before surgery  13. Place CLEAN SHEETS on your bed the night  before your surgery  14. DO NOT SLEEP WITH PETS.   Day of Surgery: Take a shower with CHG soap. Wear Clean/Comfortable clothing the morning of surgery Do not apply any deodorants/lotions.   Remember to brush your teeth WITH YOUR REGULAR TOOTHPASTE.   Please read over the following fact sheets that you were given.

## 2020-05-31 ENCOUNTER — Encounter (HOSPITAL_COMMUNITY): Payer: Self-pay

## 2020-05-31 ENCOUNTER — Encounter (HOSPITAL_COMMUNITY)
Admission: RE | Admit: 2020-05-31 | Discharge: 2020-05-31 | Disposition: A | Discharge status: Home / Self Care | Payer: BC Managed Care – PPO | Source: Ambulatory Visit | Attending: Thoracic Surgery (Cardiothoracic Vascular Surgery) | Admitting: Thoracic Surgery (Cardiothoracic Vascular Surgery)

## 2020-05-31 ENCOUNTER — Other Ambulatory Visit: Payer: Self-pay

## 2020-05-31 ENCOUNTER — Ambulatory Visit (HOSPITAL_COMMUNITY)
Admission: RE | Admit: 2020-05-31 | Discharge: 2020-05-31 | Disposition: A | Discharge status: Home / Self Care | Payer: BC Managed Care – PPO | Source: Ambulatory Visit | Attending: Thoracic Surgery (Cardiothoracic Vascular Surgery) | Admitting: Thoracic Surgery (Cardiothoracic Vascular Surgery)

## 2020-05-31 ENCOUNTER — Other Ambulatory Visit (HOSPITAL_COMMUNITY)
Admission: RE | Admit: 2020-05-31 | Discharge: 2020-05-31 | Disposition: A | Discharge status: Home / Self Care | Payer: BC Managed Care – PPO | Source: Ambulatory Visit

## 2020-05-31 ENCOUNTER — Other Ambulatory Visit (HOSPITAL_COMMUNITY): Payer: BC Managed Care – PPO

## 2020-05-31 DIAGNOSIS — Z01818 Encounter for other preprocedural examination: Secondary | ICD-10-CM | POA: Diagnosis not present

## 2020-05-31 DIAGNOSIS — Z20822 Contact with and (suspected) exposure to covid-19: Secondary | ICD-10-CM | POA: Diagnosis not present

## 2020-05-31 DIAGNOSIS — E785 Hyperlipidemia, unspecified: Secondary | ICD-10-CM | POA: Diagnosis not present

## 2020-05-31 DIAGNOSIS — I10 Essential (primary) hypertension: Secondary | ICD-10-CM | POA: Insufficient documentation

## 2020-05-31 DIAGNOSIS — I351 Nonrheumatic aortic (valve) insufficiency: Secondary | ICD-10-CM | POA: Diagnosis not present

## 2020-05-31 DIAGNOSIS — Z87891 Personal history of nicotine dependence: Secondary | ICD-10-CM | POA: Insufficient documentation

## 2020-05-31 HISTORY — DX: Cardiac murmur, unspecified: R01.1

## 2020-05-31 HISTORY — DX: Fatty (change of) liver, not elsewhere classified: K76.0

## 2020-05-31 LAB — COMPREHENSIVE METABOLIC PANEL
ALT: 44 U/L (ref 0–44)
AST: 23 U/L (ref 15–41)
Albumin: 4.1 g/dL (ref 3.5–5.0)
Alkaline Phosphatase: 70 U/L (ref 38–126)
Anion gap: 7 (ref 5–15)
BUN: 15 mg/dL (ref 6–20)
CO2: 24 mmol/L (ref 22–32)
Calcium: 8.9 mg/dL (ref 8.9–10.3)
Chloride: 104 mmol/L (ref 98–111)
Creatinine, Ser: 0.84 mg/dL (ref 0.61–1.24)
GFR, Estimated: >60 mL/min (ref >60)
Glucose, Bld: 102 mg/dL — ABNORMAL HIGH (ref 70–99)
Potassium: 4 mmol/L (ref 3.5–5.1)
Sodium: 135 mmol/L (ref 135–145)
Total Bilirubin: 0.8 mg/dL (ref 0.3–1.2)
Total Protein: 6.8 g/dL (ref 6.5–8.1)

## 2020-05-31 LAB — BLOOD GAS, ARTERIAL
Acid-Base Excess: 1.1 mmol/L (ref 0.0–2.0)
Bicarbonate: 24.6 mmol/L (ref 20.0–28.0)
Drawn by: 597931
FIO2: 21
O2 Saturation: 98.3 %
Patient temperature: 37
pCO2 arterial: 35.4 mmHg (ref 32.0–48.0)
pH, Arterial: 7.456 — ABNORMAL HIGH (ref 7.350–7.450)
pO2, Arterial: 106 mmHg (ref 83.0–108.0)

## 2020-05-31 LAB — APTT: aPTT: 34 seconds (ref 24–36)

## 2020-05-31 LAB — CBC
HCT: 50 % (ref 39.0–52.0)
Hemoglobin: 17.3 g/dL — ABNORMAL HIGH (ref 13.0–17.0)
MCH: 31.3 pg (ref 26.0–34.0)
MCHC: 34.6 g/dL (ref 30.0–36.0)
MCV: 90.4 fL (ref 80.0–100.0)
Platelets: 274 10*3/uL (ref 150–400)
RBC: 5.53 MIL/uL (ref 4.22–5.81)
RDW: 12.8 % (ref 11.5–15.5)
WBC: 13 10*3/uL — ABNORMAL HIGH (ref 4.0–10.5)
nRBC: 0.2 % (ref 0.0–0.2)

## 2020-05-31 LAB — PROTIME-INR
INR: 1 (ref 0.8–1.2)
Prothrombin Time: 13 seconds (ref 11.4–15.2)

## 2020-05-31 LAB — URINALYSIS, ROUTINE W REFLEX MICROSCOPIC
Bilirubin Urine: NEGATIVE
Glucose, UA: NEGATIVE mg/dL
Hgb urine dipstick: NEGATIVE
Ketones, ur: NEGATIVE mg/dL
Leukocytes,Ua: NEGATIVE
Nitrite: NEGATIVE
Protein, ur: NEGATIVE mg/dL
Specific Gravity, Urine: 1.014 (ref 1.005–1.030)
pH: 6 (ref 5.0–8.0)

## 2020-05-31 LAB — HEMOGLOBIN A1C
Hgb A1c MFr Bld: 5.6 % (ref 4.8–5.6)
Mean Plasma Glucose: 114.02 mg/dL

## 2020-05-31 LAB — TYPE AND SCREEN
ABO/RH(D): AB POS
Antibody Screen: NEGATIVE

## 2020-05-31 LAB — SURGICAL PCR SCREEN
MRSA, PCR: NEGATIVE
Staphylococcus aureus: NEGATIVE

## 2020-05-31 NOTE — Progress Notes (Signed)
PCP - [place] Cardiologist - Bing Matter  PPM/ICD - n/a Device Orders -  Rep Notified -   Chest x-ray - 05/31/20 EKG - 05/31/20 Stress Test - n/a ECHO - 05/08/20 Cardiac Cath - 03/15/20   Sleep Study - yes, [facility] pulmonary - requested CPAP - yes  Fasting Blood Sugar - n/a Checks Blood Sugar _____ times a day  Blood Thinner Instructions: n/a Aspirin Instructions: n/a  ERAS Protcol - n/a PRE-SURGERY Ensure or G2-   COVID TEST- 05/31/20 prior to PAT   Anesthesia review:  Yes, cardiac history, recent dx of COPD  Patient denies shortness of breath, fever, cough and chest pain at PAT appointment   All instructions explained to the patient, with a verbal understanding of the material. Patient agrees to go over the instructions while at home for a better understanding. Patient also instructed to self quarantine after being tested for COVID-19. The opportunity to ask questions was provided.

## 2020-06-01 LAB — SARS CORONAVIRUS 2 (TAT 6-24 HRS): SARS Coronavirus 2: NEGATIVE

## 2020-06-03 ENCOUNTER — Encounter: Payer: Self-pay | Admitting: Thoracic Surgery (Cardiothoracic Vascular Surgery)

## 2020-06-03 ENCOUNTER — Ambulatory Visit: Payer: BC Managed Care – PPO | Admitting: Thoracic Surgery (Cardiothoracic Vascular Surgery)

## 2020-06-03 ENCOUNTER — Other Ambulatory Visit: Payer: Self-pay

## 2020-06-03 ENCOUNTER — Encounter (HOSPITAL_COMMUNITY): Payer: Self-pay | Admitting: Thoracic Surgery (Cardiothoracic Vascular Surgery)

## 2020-06-03 VITALS — BP 135/70 | HR 89 | Resp 20 | Ht 70.0 in

## 2020-06-03 DIAGNOSIS — I351 Nonrheumatic aortic (valve) insufficiency: Secondary | ICD-10-CM | POA: Diagnosis not present

## 2020-06-03 DIAGNOSIS — I5032 Chronic diastolic (congestive) heart failure: Secondary | ICD-10-CM | POA: Diagnosis present

## 2020-06-03 MED ORDER — DEXMEDETOMIDINE HCL IN NACL 400 MCG/100ML IV SOLN
0.1000 ug/kg/h | INTRAVENOUS | Status: AC
  Administered 2020-06-04: .4 ug/kg/h via INTRAVENOUS
  Filled 2020-06-03: qty 100

## 2020-06-03 MED ORDER — INSULIN REGULAR(HUMAN) IN NACL 100-0.9 UT/100ML-% IV SOLN
INTRAVENOUS | Status: AC
  Administered 2020-06-04: 1.1 [IU]/h via INTRAVENOUS
  Filled 2020-06-03: qty 100

## 2020-06-03 MED ORDER — EPINEPHRINE HCL 5 MG/250ML IV SOLN IN NS
0.0000 ug/min | INTRAVENOUS | Status: DC
Start: 2020-06-04 — End: 2020-06-04
  Filled 2020-06-03: qty 250

## 2020-06-03 MED ORDER — POTASSIUM CHLORIDE 2 MEQ/ML IV SOLN
80.0000 meq | INTRAVENOUS | Status: DC
  Filled 2020-06-03: qty 40

## 2020-06-03 MED ORDER — TRANEXAMIC ACID (OHS) PUMP PRIME SOLUTION
2.0000 mg/kg | INTRAVENOUS | Status: DC
  Filled 2020-06-03: qty 2.46

## 2020-06-03 MED ORDER — MANNITOL 20 % IV SOLN
Freq: Once | INTRAVENOUS | Status: DC
  Filled 2020-06-03: qty 13

## 2020-06-03 MED ORDER — TRANEXAMIC ACID (OHS) BOLUS VIA INFUSION
15.0000 mg/kg | INTRAVENOUS | Status: AC
  Administered 2020-06-04: 1848 mg via INTRAVENOUS
  Filled 2020-06-03: qty 1848

## 2020-06-03 MED ORDER — PHENYLEPHRINE HCL-NACL 20-0.9 MG/250ML-% IV SOLN
30.0000 ug/min | INTRAVENOUS | Status: AC
  Administered 2020-06-04: 10 ug/min via INTRAVENOUS
  Filled 2020-06-03: qty 250

## 2020-06-03 MED ORDER — PLASMA-LYTE 148 IV SOLN
INTRAVENOUS | Status: DC
  Filled 2020-06-03: qty 2.5

## 2020-06-03 MED ORDER — MILRINONE LACTATE IN DEXTROSE 20-5 MG/100ML-% IV SOLN
0.3000 ug/kg/min | INTRAVENOUS | Status: DC
  Filled 2020-06-03: qty 100

## 2020-06-03 MED ORDER — SODIUM CHLORIDE 0.9 % IV SOLN
INTRAVENOUS | Status: DC
  Filled 2020-06-03: qty 30

## 2020-06-03 MED ORDER — VANCOMYCIN HCL 1000 MG IV SOLR
INTRAVENOUS | Status: DC
  Filled 2020-06-03: qty 1000

## 2020-06-03 MED ORDER — NITROGLYCERIN IN D5W 200-5 MCG/ML-% IV SOLN
2.0000 ug/min | INTRAVENOUS | Status: DC
  Filled 2020-06-03: qty 250

## 2020-06-03 MED ORDER — VANCOMYCIN HCL 1250 MG/250ML IV SOLN
1250.0000 mg | INTRAVENOUS | Status: AC
  Administered 2020-06-04: 300 mg via INTRAVENOUS
  Filled 2020-06-03: qty 250

## 2020-06-03 MED ORDER — SODIUM CHLORIDE 0.9 % IV SOLN
750.0000 mg | INTRAVENOUS | Status: AC
  Administered 2020-06-04: 750 mg via INTRAVENOUS
  Filled 2020-06-03: qty 750

## 2020-06-03 MED ORDER — NOREPINEPHRINE 4 MG/250ML-% IV SOLN
0.0000 ug/min | INTRAVENOUS | Status: DC
  Filled 2020-06-03: qty 250

## 2020-06-03 MED ORDER — TRANEXAMIC ACID 1000 MG/10ML IV SOLN
1.5000 mg/kg/h | INTRAVENOUS | Status: AC
  Administered 2020-06-04: 1.5 mg/kg/h via INTRAVENOUS
  Filled 2020-06-03: qty 25

## 2020-06-03 MED ORDER — SODIUM CHLORIDE 0.9 % IV SOLN
1.5000 g | INTRAVENOUS | Status: AC
  Administered 2020-06-04: 1.5 g via INTRAVENOUS
  Filled 2020-06-03: qty 1.5

## 2020-06-03 NOTE — H&P (Signed)
[address]       [place] [address]             [phone]          CARDIOTHORACIC SURGERY HISTORY AND PHYSICAL EXAM  Referring Provider is Jackson Lea, MD PCP is Jackson Garibaldi Excell Seltzer., MD  Chief Complaint  Patient presents with  . Aortic Insuffiency    Initial surgical consult, ECHO 1/20, cath 2/11    HPI:  Patient is 55 year old moderately obese male with history of hypertension, hyperlipidemia, and obstructive sleep apnea on CPAP who has been referred for surgical consultation to discuss treatment options for management of severe aortic insufficiency.  Patient states that he was first noted to have a heart murmur approximately 5 or 6 years ago.  He underwent echocardiogram and stress testing at that time and was found to have moderate to severe aortic insufficiency with preserved left ventricular systolic function.  He has remained essentially asymptomatic and followed carefully ever since by Dr. Bing [facility].  Recent follow-up echocardiogram revealed severe aortic insufficiency with preserved left ventricular systolic function but significant increase in left ventricular chamber size over the last 2 years.  The patient subsequently underwent diagnostic cardiac catheterization which confirmed the presence of severe aortic insufficiency and revealed mild nonobstructive coronary artery disease with normal right heart pressures.  Cardiothoracic surgical consultation was requested.  Patient is married and lives in [place] with his wife.  He works full-time for the [employer], which he is able to do living remotely.  He lives a somewhat sedentary lifestyle although he and his wife [place].  He is able to take care of many chores including mowing a large amount of land during the growing season without significant limitation.  He does admit to longstanding history of poor energy and decreased  exercise tolerance.  He admits that he gets short of breath when walking at a brisk pace with his wife.  He states that he simply cannot keep up with her because of exertional shortness of breath.  He denies shortness of breath with ordinary activities and he has never had any resting shortness of breath, PND, orthopnea, or lower extremity edema.  He has never had any exertional chest pain or chest tightness.  He had a brief dizzy spell back in December but this has been attributed to dehydration.  Patient is 55 year old moderately obese male with history of aortic insufficiency, hypertension, hyperlipidemia, and obstructive sleep apnea on CPAP who returns the office today with tentative plans to proceed with aortic valve replacement in the operating room tomorrow.  He was last seen here in our office on May 13, 2020 at which time we made tentative plans for surgery.  He reports no new problems or complaints.   Past Medical History:  Diagnosis Date  . 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 Dizziness 01/17/2020  . Dyslipidemia   . Emphysema of lung (HCC)   . Essential hypertension   . Fatty liver   . GERD (gastroesophageal reflux disease)   . Heart murmur    due to arotic insufficiency  . Incidental pulmonary nodule, > 3mm and < 8mm 05/01/2020   Right middle lobe - needs f/u CT  . Mixed hyperlipidemia   . Nonrheumatic aortic valve insufficiency   . Plantar fasciitis of right foot 07/17/2015  . Sleep apnea    wears CPAP    Past  Surgical History:  Procedure Laterality Date  . RIGHT/LEFT HEART CATH AND CORONARY ANGIOGRAPHY N/A 03/15/2020   Procedure: RIGHT/LEFT HEART CATH AND CORONARY ANGIOGRAPHY;  Surgeon: Jackson Bollman, MD;  Location: [facility] INVASIVE CV LAB;  Service: Cardiovascular;  Laterality: N/A;  . TEE WITHOUT CARDIOVERSION N/A 05/08/2020   Procedure: TRANSESOPHAGEAL ECHOCARDIOGRAM (TEE);  Surgeon: Jackson Fair, MD;  Location: [facility] ENDOSCOPY;  Service: Cardiovascular;  Laterality: N/A;     Family History  Problem Relation Age of Onset  . Mitral valve prolapse Mother   . Hypertension Mother   . Hypertension Father   . Stroke Maternal Grandfather   . Bone cancer Maternal Grandfather     Social History Social History   Tobacco Use  . Smoking status: Former Smoker    Packs/day: 1.00    Years: 15.00    Pack years: 15.00    Types: Cigarettes    Quit date: 09/11/2007    Years since quitting: 12.7  . Smokeless tobacco: Never Used  Vaping Use  . Vaping Use: Never used  Substance Use Topics  . Alcohol use: Yes    Alcohol/week: 1.0 standard drink    Types: 1 Cans of beer per week    Comment: socially  . Drug use: Never    Prior to Admission medications   Medication Sig Start Date End Date Taking? Authorizing Provider  acetaminophen (TYLENOL) 500 MG tablet Take 500-1,000 mg by mouth every 6 (six) hours as needed (pain).   Yes [provider]  albuterol (VENTOLIN HFA) 108 (90 Base) MCG/ACT inhaler Inhale 2 puffs into the lungs every 6 (six) hours as needed for wheezing or shortness of breath. 09/28/19  Yes [provider]  Ascorbic Acid (VITAMIN C) 1000 MG tablet Take 1,000 mg by mouth in the morning.   Yes [provider]  candesartan (ATACAND) 8 MG tablet Take 8 mg by mouth in the morning. 04/04/19  Yes [provider]  cetirizine-pseudoephedrine (ZYRTEC-D) 5-120 MG tablet Take 1 tablet by mouth 2 (two) times daily as needed for allergies.   Yes [provider]  Cholecalciferol (VITAMIN D3) 5000 units TABS Take 5,000 Units by mouth in the morning.   Yes [provider]  dextromethorphan-guaiFENesin (MUCINEX DM) 30-600 MG 12hr tablet Take 1 tablet by mouth 2 (two) times daily as needed (congestion).   Yes [provider]  EPINEPHrine 0.3 mg/0.3 mL IJ SOAJ injection Inject 0.3 mg into the muscle as needed for anaphylaxis. 08/15/19  Yes [provider]  ibuprofen (ADVIL) 200 MG tablet Take 800 mg by  mouth every 8 (eight) hours as needed for headache or moderate pain.   Yes [provider]  lansoprazole (PREVACID) 15 MG capsule Take 15 mg by mouth daily.   Yes [provider]  metoprolol tartrate (LOPRESSOR) 50 MG tablet Take 1 tablet (50 mg total) by mouth daily. Patient taking differently: Take 50 mg by mouth in the morning. 02/26/20 05/26/20 Yes Jackson Lea, MD  Multiple Vitamin (MULTIVITAMIN WITH MINERALS) TABS tablet Take 1 tablet by mouth in the morning. Centrum for Men   Yes [provider]  pravastatin (PRAVACHOL) 10 MG tablet Take 5 mg by mouth in the morning.   Yes [provider]  psyllium (METAMUCIL SMOOTH TEXTURE) 28 % packet Take 1 packet by mouth in the morning.   Yes [provider]  sildenafil (REVATIO) 20 MG tablet Take 20 mg by mouth daily as needed (erectile dysfunction). 07/01/17  Yes [provider]  Testosterone 20.25  MG/ACT (1.62%) GEL Apply 2 Pump topically daily. APPLIED ON CHEST IN THE MORNING 10/23/19   [provider]    Allergies  Allergen Reactions  . Penicillin G Swelling      Review of Systems:              General:                      normal appetite, decreased energy, no weight gain, no weight loss, no fever             Cardiac:                       no chest pain with exertion, no chest pain at rest, + SOB with more strenuous exertion, no resting SOB, no PND, no orthopnea, no palpitations, no arrhythmia, no atrial fibrillation, no LE edema, no dizzy spells, no syncope             Respiratory:                 no shortness of breath, no home oxygen, no productive cough, no dry cough, no bronchitis, no wheezing, no hemoptysis, no asthma, no pain with inspiration or cough, + sleep apnea, + CPAP at night             GI:                               no difficulty swallowing, + reflux, no frequent heartburn, no hiatal hernia, no abdominal pain, no constipation, no diarrhea, no hematochezia,  no hematemesis, no melena             GU:                              no dysuria,  no frequency, no urinary tract infection, no hematuria, no enlarged prostate, no kidney stones, no kidney disease             Vascular:                     no pain suggestive of claudication, no pain in feet, no leg cramps, no varicose veins, no DVT, no non-healing foot ulcer             Neuro:                         no stroke, no TIA's, no seizures, no headaches, no temporary blindness one eye,  no slurred speech, no peripheral neuropathy, no chronic pain, no instability of gait, no memory/cognitive dysfunction             Musculoskeletal:         no arthritis, no joint swelling, no myalgias, no difficulty walking, normal mobility              Skin:                            no rash, no itching, no skin infections, no pressure sores or ulcerations             Psych:                         no anxiety, no  depression, no nervousness, no unusual recent stress             Eyes:                           no blurry vision, no floaters, no recent vision changes, + wears glasses or contacts             ENT:                            no hearing loss, no loose or painful teeth, no dentures, last saw dentist > 2 years ago             Hematologic:               no easy bruising, no abnormal bleeding, no clotting disorder, no frequent epistaxis             Endocrine:                   no diabetes, does not check CBG's at home                                                       Physical Exam:              BP (!) 144/71 (BP Location: Right Arm, Patient Position: Sitting)   Pulse 70   Resp 20   Ht 6' (1.829 m)   Wt 284 lb (128.8 kg)   SpO2 99% Comment: RA  BMI 38.52 kg/m              General:                      Obese,  well-appearing             HEENT:                       Unremarkable              Neck:                           no JVD, no bruits, no adenopathy              Chest:                           clear to auscultation, symmetrical breath sounds, no wheezes, no rhonchi              CV:                              RRR, grade II/VI blowing diastolic murmur heard best at LSB             Abdomen:                    soft, non-tender, no masses              Extremities:                 warm, well-perfused, pulses palpabld, no LE  edema             Rectal/GU                   Deferred             Neuro:                         Grossly non-focal and symmetrical throughout             Skin:                            Clean and dry, no rashes, no breakdown   Diagnostic Tests:  ECHOCARDIOGRAM LIMITED REPORT       Patient Name:  [facility] Date of Exam: 02/22/2020  Medical Rec #: [id number]      Height:    72.0 in  Accession #:  [id number]     Weight:    284.0 lb  Date of Birth: 28-Nov-1965     BSA:     2.473 m  Patient Age:  54 years      BP:      150/62 mmHg  Patient Gender: M          HR:      83 bpm.  Exam Location: [place]   Procedure: 2D Echo and Intracardiac Opacification Agent   Indications:  Aortic valve disorder I35.9    History:    Patient has prior history of Echocardiogram examinations,  most         recent 11/27/2019.  Signs/Symptoms:Dizziness/Lightheadedness;         Risk Factors:Hypertension.    Sonographer:  Louie Boston  Referring Phys: Marveen Reeks KRASOWSKI   IMPRESSIONS    1. Left ventricular ejection fraction, by estimation, is 60 to 65%. The  left ventricle has normal function. The left ventricle has no regional  wall motion abnormalities. Left ventricular diastolic parameters are  consistent with Grade I diastolic  dysfunction (impaired relaxation).  2. The aortic valve is normal in structure. Aortic valve regurgitation is  moderate to severe. No aortic stenosis is present.  3. There is mild dilatation of the ascending aorta, measuring 38 mm.   FINDINGS   Left Ventricle: Left ventricular ejection fraction, by estimation, is 60  to 65%. The left ventricle has normal function. The left ventricle has no  regional wall motion abnormalities. Definity contrast agent was given IV  to delineate the left ventricular  endocardial borders. The left ventricular internal cavity size was normal  in size. There is no left ventricular hypertrophy. Left ventricular  diastolic parameters are consistent with Grade I diastolic dysfunction  (impaired relaxation).   Right Ventricle: The right ventricular size is normal. No increase in  right ventricular wall thickness. Right ventricular systolic function is  normal. There is normal pulmonary artery systolic pressure. The tricuspid  regurgitant velocity is 2.05 m/s, and  with an assumed right atrial pressure of 3 mmHg, the estimated right  ventricular systolic pressure is 19.8 mmHg.   Left Atrium: Left atrial size was normal in size.   Right Atrium: Right atrial size was normal in size.   Pericardium: There is no evidence of pericardial effusion.   Mitral Valve: The mitral valve is normal in structure. Trivial mitral  valve regurgitation. No evidence of mitral valve stenosis.   Tricuspid Valve: The tricuspid valve is normal in structure. Tricuspid  valve regurgitation is  not demonstrated. No evidence of tricuspid  stenosis.   Aortic Valve: The aortic valve is normal in structure. Aortic valve  regurgitation is moderate to severe. Aortic regurgitation PHT measures 333  msec. No aortic stenosis is present.   Pulmonic Valve: The pulmonic valve was normal in structure. Pulmonic valve  regurgitation is not visualized. No evidence of pulmonic stenosis.   Aorta: The aortic root is normal in size and structure. There is mild  dilatation of the ascending aorta, measuring 38 mm.   Venous: The inferior vena cava is normal in size with greater than 50%  respiratory variability, suggesting right atrial  pressure of 3 mmHg.   IAS/Shunts: No atrial level shunt detected by color flow Doppler.   LEFT VENTRICLE  PLAX 2D  LVIDd:     7.00 cm   Diastology  LVIDs:     4.60 cm   LV e' medial:  6.31 cm/s  LV PW:     1.00 cm   LV E/e' medial: 15.4  LV IVS:    1.10 cm   LV e' lateral:  7.72 cm/s  LVOT diam:   2.70 cm   LV E/e' lateral: 12.6  LV SV:     164  LV SV Index:  66  LVOT Area:   5.73 cm    LV Volumes (MOD)  LV vol d, MOD A4C: 150.0 ml  LV vol s, MOD A4C: 61.0 ml  LV SV MOD A4C:   150.0 ml   RIGHT VENTRICLE       IVC  RV S prime:   17.00 cm/s IVC diam: 1.30 cm  TAPSE (M-mode): 2.9 cm   LEFT ATRIUM       Index    RIGHT ATRIUM      Index  LA diam:    4.00 cm 1.62 cm/m RA Area:   13.40 cm  LA Vol (A2C):  69.0 ml 27.90 ml/m RA Volume:  26.70 ml 10.80 ml/m  LA Vol (A4C):  42.3 ml 17.11 ml/m  LA Biplane Vol: 55.0 ml 22.24 ml/m  AORTIC VALVE  LVOT Vmax:  128.00 cm/s  LVOT Vmean: 87.700 cm/s  LVOT VTI:  0.286 m  AI PHT:   333 msec    AORTA  Ao Root diam: 3.70 cm  Ao Asc diam: 3.80 cm  Ao Desc diam: 2.40 cm   MITRAL VALVE        TRICUSPID VALVE  MV Area (PHT): 4.21 cm   TR Peak grad:  16.8 mmHg  MV Decel Time: 180 msec   TR Vmax:    205.00 cm/s  MV E velocity: 96.90 cm/s  MV A velocity: 104.00 cm/s SHUNTS  MV E/A ratio: 0.93     Systemic VTI: 0.29 m               Systemic Diam: 2.70 cm   Belva Crome MD  Electronically signed by Belva Crome MD  Signature Date/Time: 02/22/2020/2:52:21 PM        RIGHT/LEFT HEART CATH AND CORONARY ANGIOGRAPHY    Conclusion    There is no aortic valve stenosis. There is severe (4+) aortic regurgitation.  1. Patent coronary arteries with mild diffuse nonobstructive CAD as outlined, no significant stenoses are present 2. Severe aortic valve insufficiency based on aortic root  angiography 3. Essentially normal right heart hemodynamics   Indications  Severe aortic insufficiency [I35.1 (ICD-10-CM)]   Procedural Details  Technical Details INDICATION: Severe aortic insufficiency with progressive LV dilatation, preoperative study  PROCEDURAL DETAILS:  The right wrist is prepped, draped, and anesthetized with 1% lidocaine. Using the modified Seldinger technique, a 5/6 French Slender sheath is introduced into the right radial artery. 3 mg of verapamil is administered through the sheath, weight-based unfractionated heparin was administered intravenously. There is marked right subclavian and innominate tortuosity. This necessitates use of a 75 cm Terumo sheath. Additional heparin is administered. Standard Judkins catheters are used for selective coronary angiography. LV pressure is recorded and an aortic valve pullback gradient is measured. Aortic root angiography is performed to assess severity of aortic valve insufficiency. Catheter exchanges are performed over an exchange length guidewire. There are no immediate procedural complications. A TR band is used for radial hemostasis at the completion of the procedure. The patient was transferred to the post catheterization recovery area for further monitoring.    Estimated blood loss <50 mL.   During this procedure medications were administered to achieve and maintain moderate conscious sedation while the patient's heart rate, blood pressure, and oxygen saturation were continuously monitored and I was present face-to-face 100% of this time.   Medications (Filter: Administrations occurring from 0727 to 0833 on 03/15/20) (important) Continuous medications are totaled by the amount administered until 03/15/20 0833.    fentaNYL (SUBLIMAZE) injection (mcg) Total dose:  25 mcg  Date/Time Rate/Dose/Volume Action   03/15/20 0733 25 mcg Given    midazolam (VERSED) injection (mg) Total dose:  2  mg  Date/Time Rate/Dose/Volume Action   03/15/20 0733 2 mg Given    Heparin (Porcine) in NaCl 1000-0.9 UT/500ML-% SOLN (mL) Total volume:  1,000 mL  Date/Time Rate/Dose/Volume Action   03/15/20 0734 500 mL Given   0734 500 mL Given    lidocaine (PF) (XYLOCAINE) 1 % injection (mL) Total volume:  2 mL  Date/Time Rate/Dose/Volume Action   03/15/20 0749 2 mL Given    Radial Cocktail/Verapamil only (mL) Total volume:  10 mL  Date/Time Rate/Dose/Volume Action   03/15/20 0757 10 mL Given    heparin sodium (porcine) injection (Units) Total dose:  8,000 Units  Date/Time Rate/Dose/Volume Action   03/15/20 0759 6,000 Units Given   0810 2,000 Units Given    iohexol (OMNIPAQUE) 350 MG/ML injection (mL) Total volume:  90 mL  Date/Time Rate/Dose/Volume Action   03/15/20 0829 90 mL Given    Sedation Time  Sedation Time Physician-1: 54 minutes 23 seconds   Contrast  Medication Name Total Dose  iohexol (OMNIPAQUE) 350 MG/ML injection 90 mL    Radiation/Fluoro  Fluoro time: 10.7 (min) DAP: 33979 (mGycm2) Cumulative Air Kerma: 567 (mGy)   Coronary Findings   Diagnostic Dominance: Right  Left Main  Widely patent vessel without stenosis. Divides into the LAD and circumflex.  Left Anterior Descending  Vessel was injected. Vessel is large. The vessel exhibits minimal luminal irregularities. Patent vessel to the apex of the heart, patent first diagonal branch with no stenosis, mild irregularities throughout.  Left Circumflex  The circumflex is a very large vessel supplying a large obtuse marginal that divides into multiple vessels and supplies much of the lateral wall. There is no stenosis throughout the circumflex distribution.  Right Coronary Artery  Vessel is large. There is mild diffuse disease throughout the vessel. Dominant vessel, relatively smooth with areas of mild tapering in the mid vessel, no more than 30% stenosis.    Intervention   No interventions have been documented.  Left Heart  Aortic Valve There is no aortic valve stenosis. There is severe (4+) aortic regurgitation. No calcification found in  the aortic valve.   Coronary Diagrams   Diagnostic Dominance: Right    Intervention    Implants    No implant documentation for this case.    Syngo Images  Show images for CARDIAC CATHETERIZATION  Images on Long Term Storage  Show images for Press, [place] to Procedure Log  Procedure Log     Hemo Data  Flowsheet Row Most Recent Value  Fick Cardiac Output 6.22 L/min  Fick Cardiac Output Index 2.53 (L/min)/BSA  RA A Wave 7 mmHg  RA V Wave 5 mmHg  RA Mean 7 mmHg  RV Systolic Pressure 27 mmHg  RV Diastolic Pressure 2 mmHg  RV EDP 7 mmHg  PA Systolic Pressure 26 mmHg  PA Diastolic Pressure 16 mmHg  PA Mean 21 mmHg  PW A Wave 21 mmHg  PW V Wave 23 mmHg  PW Mean 17 mmHg  AO Systolic Pressure 121 mmHg  AO Diastolic Pressure 63 mmHg  AO Mean 92 mmHg  LV Systolic Pressure 115 mmHg  LV Diastolic Pressure 12 mmHg  LV EDP 16 mmHg  LVp Systolic Pressure 118 mmHg  LVp Diastolic Pressure 10 mmHg  LVp EDP Pressure 16 mmHg  QP/QS 1  TPVR Index 8.3 HRUI  TSVR Index 36.35 HRUI  PVR SVR Ratio 0.05  TPVR/TSVR Ratio 0.23    TRANSESOPHOGEAL ECHO REPORT       Patient Name:  [facility] Date of Exam: 05/08/2020  Medical Rec #: [id number]      Height:    72.0 in  Accession #:  [id number]     Weight:    284.0 lb  Date of Birth: 08-Jul-1965     BSA:     2.473 m  Patient Age:  54 years      BP:      151/66 mmHg  Patient Gender: M          HR:      82 bpm.  Exam Location: Inpatient   Procedure: 3D Echo, Transesophageal Echo, Cardiac Doppler and Color  Doppler   Indications:   Aortic insufficiency    History:     Patient has prior history of Echocardiogram  examinations,  most          recent 02/22/2020. Risk Factors:Hypertension.    Sonographer:   Ross Ludwig RDCS (AE)  Referring Phys: [id number]  Diagnosing Phys: [facility] Fair MD   PROCEDURE: After discussion of the risks and benefits of a TEE, an  informed consent was obtained from the patient. The transesophogeal probe  was passed without difficulty through the esophogus of the patient.  Sedation performed by different physician.  The patient was monitored while under deep sedation. Anesthestetic  sedation was provided intravenously by Anesthesiology: 637.06mg  of  Propofol, 100mg  of Lidocaine. Image quality was good. The patient  developed no complications during the procedure.   IMPRESSIONS    1. Left ventricular ejection fraction, by estimation, is 60 to 65%. The  left ventricle has normal function. The left ventricle has no regional  wall motion abnormalities. The left ventricular internal cavity size was  moderately dilated. Left ventricular  diastolic parameters were normal.  2. Right ventricular systolic function is normal. The right ventricular  size is normal.  3. No left atrial/left atrial appendage thrombus was detected.  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 Trivial mitral valve regurgitation.  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  5. All three aortic cusps appear redundant  and exhibit diastolic  prolapse. The aortic valve is tricuspid. Aortic valve regurgitation is  severe. No aortic stenosis is present. Aortic regurgitation PHT measures  230 msec.  6. There is borderline dilatation of the aortic root, measuring 39 mm.   FINDINGS  Left Ventricle: Left ventricular ejection fraction, by estimation, is 60  to 65%. The left ventricle has normal function. The left ventricle has no  regional wall motion abnormalities. The left ventricular internal cavity  size was moderately dilated.  There is no left  ventricular hypertrophy. Left ventricular diastolic  parameters were normal.   Right Ventricle: The right ventricular size is normal. No increase in  right ventricular wall thickness. Right ventricular systolic function is  normal.   Left Atrium: Left atrial size was normal in size. No left atrial/left  atrial appendage thrombus was detected.   Right Atrium: Right atrial size was normal in size.   Pericardium: There is no evidence of pericardial effusion.   Mitral Valve: The mitral valve is myxomatous. There is mild late systolic  prolapse of both leaflets of the mitral valve. Trivial mitral valve  regurgitation.   Tricuspid Valve: The tricuspid valve is normal in structure. Tricuspid  valve regurgitation is trivial.   Aortic Valve: All three aortic cusps appear redundant and exhibit  diastolic prolapse. The aortic valve is tricuspid. Aortic valve  regurgitation is severe. Aortic regurgitation PHT measures 230 msec. No  aortic stenosis is present.   Pulmonic Valve: The pulmonic valve was normal in structure. Pulmonic valve  regurgitation is trivial.   Aorta: The aortic root, ascending aorta and aortic arch are all  structurally normal, with no evidence of dilitation or obstruction. There  is borderline dilatation of the aortic root, measuring 39 mm.   IAS/Shunts: There is redundancy of the interatrial septum. No atrial level  shunt detected by color flow Doppler.     LEFT VENTRICLE  PLAX 2D  LVIDd:     6.24 cm  LVIDs:     4.08 cm  LVOT diam:   2.72 cm  LVOT Area:   5.80 cm     AORTIC VALVE  AI PHT:      230 msec  AR Vena Contracta: 0.50 cm    AORTA  Ao Root diam: 3.90 cm  Ao STJ diam: 2.7 cm  Ao Asc diam: 3.50 cm  Ao Arch diam: 2.9 cm     SHUNTS  Systemic Diam: 2.72 cm   Rachelle Hora Croitoru MD  Electronically signed by Jackson Fair MD  Signature Date/Time: 05/08/2020/5:48:28 PM      Cardiac TAVR CT  TECHNIQUE: The patient was  scanned on a [employer]. A 120 kV retrospective scan was triggered in the descending thoracic aorta at 111 HU's. Gantry rotation speed was 250 msecs and collimation was .6 mm. No beta blockade or nitro were given. The 3D data set was reconstructed in 5% intervals of the R-R cycle. Systolic and diastolic phases were analyzed on a dedicated work station using MPR, MIP and VRT modes. The patient received 80 cc of contrast.  FINDINGS: Aortic Root:  Aortic valve: Trileaflet  Aortic valve calcium score: 0  Aortic annulus:  Diameter: 41mm x 35mm  Perimeter:  Area: 1060 mm^2  Calcifications: No calcifications  Coronary height: Min Left - 20mm, Max Left - 24mm; Min Right - 22mm  Sinotubular height: Left cusp - 25mm; Right cusp - 27mm; Noncoronary cusp - 25mm  LVOT (as measured 3 mm below the annulus):  Diameter:  32mm x 28mm  Area: 1110 mm^2  Calcifications: No calcifications  Aortic sinus width: Left cusp - 71mm; Right cusp - 4mm; Noncoronary cusp - 77mm  Sinotubular junction width: 61mm x 69mm  Cardiac:  Right atrium: Mild enlargement  Right ventricle: Normal size  Pulmonary arteries: Normal size  Pulmonary veins: Normal configuration  Left atrium: Mild enlargement  Left ventricle: Severe dilatation  Pericardium: Normal  Coronary arteries: Calcium score 138 (82nd percentile)  IMPRESSION: 1. Trileaflet aortic valve, no calcifications  2. Aortic annulus measures 26mm x 60mm in diameter with perimeter and area 1060 mm^2. No annular calcifications  3. Severe LV dilatation  4. Coronary calcium score 138 (82nd percentile)   Electronically Signed By: Epifanio Lesches MD On: 05/01/2020 22:08    CT ANGIOGRAPHY CHEST, ABDOMEN AND PELVIS  TECHNIQUE: Non-contrast CT of the chest was initially obtained.  Multidetector CT imaging through the chest, abdomen and pelvis was performed  using the standard protocol during bolus administration of intravenous contrast. Multiplanar reconstructed images and MIPs were obtained and reviewed to evaluate the vascular anatomy.  CONTRAST: OMNIPAQUE IOHEXOL 350 MG/ML SOLN  COMPARISON: No priors.  FINDINGS: CTA CHEST FINDINGS  Cardiovascular: Heart size is enlarged with left ventricular dilatation. There is no significant pericardial fluid, thickening or pericardial calcification. There is aortic atherosclerosis, as well as atherosclerosis of the great vessels of the mediastinum and the coronary arteries, including calcified atherosclerotic plaque in the left circumflex coronary artery. Thickening of the aortic valve cusps, with incidental imaging during diastole demonstrates apparent central area of malcoaptation.  Mediastinum/Lymph Nodes: No pathologically enlarged mediastinal or hilar lymph nodes. Esophagus is unremarkable in appearance. No axillary lymphadenopathy.  Lungs/Pleura: 6 mm right middle lobe pulmonary nodule (axial image 70 of series 5). No other larger more suspicious appearing pulmonary nodules or masses are noted. No acute consolidative airspace disease. No pleural effusions.  Musculoskeletal/Soft Tissues: There are no aggressive appearing lytic or blastic lesions noted in the visualized portions of the skeleton.  CTA ABDOMEN AND PELVIS FINDINGS  Hepatobiliary: Diffuse low attenuation throughout the hepatic parenchyma, indicative of hepatic steatosis. No discrete cystic or solid hepatic lesions. No intra or extrahepatic biliary ductal dilatation. Gallbladder is normal in appearance.  Pancreas: No pancreatic mass. No pancreatic ductal dilatation. No pancreatic or peripancreatic fluid collections or inflammatory changes.  Spleen: Unremarkable.  Adrenals/Urinary Tract: Subcentimeter low-attenuation lesion in the lower pole of the right kidney and in the anterior aspect of  the upper pole of the left kidney, too small to characterize, but statistically likely to represent tiny cysts. No definite suspicious renal lesions. No hydroureteronephrosis. Urinary bladder is unremarkable in appearance. Bilateral adrenal glands are normal in appearance.  Stomach/Bowel: Normal appearance of the stomach. No pathologic dilatation of small bowel or colon. Numerous colonic diverticulae are noted, without surrounding inflammatory changes to suggest an acute diverticulitis at this time. Normal appendix.  Vascular/Lymphatic: Vascular findings and measurements pertinent to potential TAVR procedure, as detailed below. No aneurysm or dissection noted in the abdominal or pelvic vasculature. No lymphadenopathy noted in the abdomen or pelvis.  Reproductive: Prostate gland and seminal vesicles are unremarkable in appearance.  Other: No significant volume of ascites. No pneumoperitoneum.  Musculoskeletal: There are no aggressive appearing lytic or blastic lesions noted in the visualized portions of the skeleton.  VASCULAR MEASUREMENTS PERTINENT TO TAVR:  AORTA:  Minimal Aortic Diameter-21 x 19 mm  Severity of Aortic Calcification-none  RIGHT PELVIS:  Right Common Iliac Artery -  Minimal Diameter-12.1 x 9.6  mm  Tortuosity-moderate  Calcification-none  Right External Iliac Artery -  Minimal Diameter-10.0 x 9.7 mm  Tortuosity-moderate  Calcification-none  Right Common Femoral Artery -  Minimal Diameter-10.1 x 10.1 mm  Tortuosity-mild  Calcification-none  LEFT PELVIS:  Left Common Iliac Artery -  Minimal Diameter-11.0 x 10.4 mm  Tortuosity-moderate  Calcification-none  Left External Iliac Artery -  Minimal Diameter-9.6 x 9.3 mm  Tortuosity-moderate  Calcification-none  Left Common Femoral Artery -  Minimal Diameter-9.7 x 8.4 mm  Tortuosity-mild  Calcification-minimal  Review of the MIP images  confirms the above findings.  IMPRESSION: 1. Vascular findings and measurements pertinent to potential TAVR procedure, as detailed above. 2. Severe thickening of the aortic valve with apparent central region of malcoaptation, compatible with reported clinical history of severe aortic insufficiency. 3. Cardiomegaly with left ventricular dilatation. 4. Aortic atherosclerosis, in addition to left circumflex coronary artery disease. Please note that although the presence of coronary artery calcium documents the presence of coronary artery disease, the severity of this disease and any potential stenosis cannot be assessed on this non-gated CT examination. Assessment for potential risk factor modification, dietary therapy or pharmacologic therapy may be warranted, if clinically indicated. 5. 6 mm right middle lobe pulmonary nodule. Non-contrast chest CT at 6-12 months is recommended. If the nodule is stable at time of repeat CT, then future CT at 18-24 months (from today's scan) is considered optional for low-risk patients, but is recommended for high-risk patients. This recommendation follows the consensus statement: Guidelines for Management of Incidental Pulmonary Nodules Detected on CT Images: From the Fleischner Society 2017; Radiology 2017; 284:228-243. 6. Hepatic steatosis. 7. Colonic diverticulosis without evidence of acute diverticulitis at this time.   Electronically Signed By: Trudie Reed M.D. On: 05/02/2020 07:18    Pulmonary Function Tests  Baseline                                                                      Post-bronchodilator  FVC                 4.19 L  (82% predicted)          FVC                 4.14 L  (81% predicted) FEV1               3.09 L  (79% predicted)          FEV1               3.17 L  (81% predicted) FEF25-75        2.24 L  (68% predicted)          FEF25-75        2.39 L  (73% predicted)  TLC                 6.61 L  (92%  predicted) RV                   2.35 L  (106% predicted) DLCO              119% predicted       Impression:  Patient has early stage D severe symptomatic aortic insufficiency. He describes  exertional shortness of breath that occurs with more strenuous physical exertion consistent with chronic diastolic congestive heart failure, New York Heart Association functional class I-II. I have personally reviewed the patient's recent transthoracic and transesophageal echocardiograms, diagnostic cardiac catheterization, CT angiogram, and pulmonary function tests. Echocardiograms confirmed the presence of severe aortic insufficiency with dilated left ventricle and preserved left ventricular systolic function. On TEE the aortic valve was clearly trileaflet with redundant leaflet tissue, type II dysfunction with prolapse of all 3 segments, and a dilated annulus. Aortic insufficiency was described as "torrential" with pressure half-time measured 230 ms. There was no aortic stenosis. Left ventricle was moderately dilated. Left ventricular ejection fraction was estimated 60 to 65%. Gated CT angiogram of the heart confirmed the presence of trileaflet aortic valve with findings consistent with severe aortic insufficiency. There was no aneurysmal dilatation of the aortic root or proximal ascending aorta. There are no contraindications to peripheral cannulation for surgery. CT scan also revealed incidental pulmonary nodule in the right middle lobe which will require follow-up CT in 6 to 12 months. Pulmonary function testing revealed only mild changes of COPD.    Plan:  The patientand his wife were againcounseled at length regarding treatment alternatives for management of severe aortic insufficiencyincluding continued medical therapy versus proceeding with aortic valve replacement in the near future. The natural history of aortic insufficiencywas reviewed, as was long term prognosis with  medical therapy alone. Surgical options were discussed at length including conventional surgical aortic valve replacement through either a full median sternotomy or using minimally invasive techniques. Based upon review of the patient's transesophageal echocardiogram I do not recommend an attempt at valve repair. Discussion was held comparing the relative risks of mechanical valve replacement with need for lifelong anticoagulation versus use of a bioprosthetic tissue valve and the associated potential for late structural valve deterioration and failure. This discussion was placed in the context of the patient's particular circumstances, and as a result the patient specifically requests that their valve be replaced using a bioprosthetic tissue valve. The patient understands and accepts all potential associated risks of surgery including but not limited to risk of death, stroke, myocardial infarction, congestive heart failure, respiratory failure, renal failure, pneumonia, bleeding requiring blood transfusion and or reexploration, arrhythmia, heart block or bradycardia requiring permanent pacemaker, aortic dissection or other major vascular complication, pleural effusions or other delayed complications related to continued congestive heart failure, and other late complications related to valve replacement including structural valve deterioration and failure, thrombosis, endocarditis, or paravalvular leak. Alternative surgical approaches have been discussed including a comparison between conventional sternotomy and minimally-invasive techniques. The relative risks and benefits of each have been reviewed as they pertain to the patient's specific circumstances, and all of their questions have been addressed. Specific risks potentially related to the minimally-invasive approach were discussed at length, including but not limited to risk of conversion to full or partial sternotomy, aortic dissection or other major  vascular complication, unilateral acute lung injury or pulmonary edema, phrenic nerve dysfunction or paralysis, rib fracture, chronic pain, lung hernia, or lymphocele.  Furthermore, the patient understands that if placement of retrograde catheter utilized for cardioplegia and myocardial protection cannot be easily accomplished via minithoracotomy approach then we will plan to convert to a full conventional median sternotomy.  All questions have been answered.  We plan to proceed with surgery tomorrow as previously scheduled.      Salvatore Decent. Cornelius Moras, MD 06/03/2020 4:29 PM

## 2020-06-03 NOTE — Progress Notes (Signed)
[address]       [name] [address]             [phone]     CARDIOTHORACIC SURGERY OFFICE NOTE  Referring Provider is Georgeanna Lea, MD PCP is Nonnie Done., MD   HPI:  Patient is 55 year old moderately obese male with history of aortic insufficiency, hypertension, hyperlipidemia, and obstructive sleep apnea on CPAP who returns the office today with tentative plans to proceed with aortic valve replacement in the operating room tomorrow.  He was last seen here in our office on May 13, 2020 at which time we made tentative plans for surgery.  He reports no new problems or complaints.   Current Outpatient Medications  Medication Sig Dispense Refill  . acetaminophen (TYLENOL) 500 MG tablet Take 500-1,000 mg by mouth every 6 (six) hours as needed (pain).    Marland Kitchen albuterol (VENTOLIN HFA) 108 (90 Base) MCG/ACT inhaler Inhale 2 puffs into the lungs every 6 (six) hours as needed for wheezing or shortness of breath.    . Ascorbic Acid (VITAMIN C) 1000 MG tablet Take 1,000 mg by mouth in the morning.    . candesartan (ATACAND) 8 MG tablet Take 8 mg by mouth in the morning.    . cetirizine-pseudoephedrine (ZYRTEC-D) 5-120 MG tablet Take 1 tablet by mouth 2 (two) times daily as needed for allergies.    . Cholecalciferol (VITAMIN D3) 5000 units TABS Take 5,000 Units by mouth in the morning.    Marland Kitchen dextromethorphan-guaiFENesin (MUCINEX DM) 30-600 MG 12hr tablet Take 1 tablet by mouth 2 (two) times daily as needed (congestion).    Marland Kitchen EPINEPHrine 0.3 mg/0.3 mL IJ SOAJ injection Inject 0.3 mg into the muscle as needed for anaphylaxis.    Marland Kitchen ibuprofen (ADVIL) 200 MG tablet Take 800 mg by mouth every 8 (eight) hours as needed for headache or moderate pain.    Marland Kitchen lansoprazole (PREVACID) 15 MG capsule Take 15 mg by mouth daily.    . Multiple Vitamin (MULTIVITAMIN WITH MINERALS) TABS tablet Take 1 tablet by mouth in the morning. Centrum for Men    . pravastatin (PRAVACHOL) 10  MG tablet Take 5 mg by mouth in the morning.    . psyllium (METAMUCIL SMOOTH TEXTURE) 28 % packet Take 1 packet by mouth in the morning.    . sildenafil (REVATIO) 20 MG tablet Take 20 mg by mouth daily as needed (erectile dysfunction).  5  . Testosterone 20.25 MG/ACT (1.62%) GEL Apply 2 Pump topically daily. APPLIED ON CHEST IN THE MORNING    . metoprolol tartrate (LOPRESSOR) 50 MG tablet Take 1 tablet (50 mg total) by mouth daily. (Patient taking differently: Take 50 mg by mouth in the morning.) 90 tablet 3   No current facility-administered medications for this visit.   Facility-Administered Medications Ordered in Other Visits  Medication Dose Route Frequency Provider Last Rate Last Admin  . [START ON 06/04/2020] cefUROXime (ZINACEF) 1.5 g in sodium chloride 0.9 % 100 mL IVPB  1.5 g Intravenous To OR Scarlett Presto, RPH      . [START ON 06/04/2020] cefUROXime (ZINACEF) 750 mg in sodium chloride 0.9 % 100 mL IVPB  750 mg Intravenous To OR Scarlett Presto, RPH      . [START ON 06/04/2020] dexmedetomidine (PRECEDEX) 400 MCG/100ML (4 mcg/mL) infusion  0.1-0.7 mcg/kg/hr Intravenous To OR Scarlett Presto, RPH      . [START ON 06/04/2020] EPINEPHrine (ADRENALIN) 4 mg in NS  250 mL (0.016 mg/mL) premix infusion  0-10 mcg/min Intravenous To OR Scarlett Presto, [facility]      . [START ON 06/04/2020] heparin 30,000 units/NS 1000 mL solution for CELLSAVER   Other To OR Scarlett Presto, [facility]      . [START ON 06/04/2020] heparin sodium (porcine) 2,500 Units, papaverine 30 mg in electrolyte-148 (PLASMALYTE-148) 500 mL irrigation   Irrigation To OR Scarlett Presto, [facility]      . [START ON 06/04/2020] insulin regular, human (MYXREDLIN) 100 units/ 100 mL infusion   Intravenous To OR Scarlett Presto, [facility]      . [START ON 06/04/2020] [facility] Blood Cardioplegia vial (lidocaine/magnesium/mannitol 0.26g-4g-6.4g)   Intracoronary Once Scarlett Presto, [facility]      . [START ON 06/04/2020] milrinone (PRIMACOR) 20 MG/100 ML (0.2 mg/mL) infusion  0.3  mcg/kg/min Intravenous To OR Scarlett Presto, RPH      . [START ON 06/04/2020] nitroGLYCERIN 50 mg in dextrose 5 % 250 mL (0.2 mg/mL) infusion  2-200 mcg/min Intravenous To OR Scarlett Presto, RPH      . [START ON 06/04/2020] norepinephrine (LEVOPHED) 4mg  in premix infusion  0-40 mcg/min Intravenous To OR , [facility]      . [START ON 06/04/2020] phenylephrine (NEOSYNEPHRINE) 20-0.9 MG/250ML-% infusion  30-200 mcg/min Intravenous To OR 08/04/2020, [facility]      . [START ON 06/04/2020] potassium chloride injection 80 mEq  80 mEq Other To OR 08/04/2020, [facility]      . [START ON 06/04/2020] tranexamic acid (CYKLOKAPRON) 2,500 mg in sodium chloride 0.9 % 250 mL (10 mg/mL) infusion  1.5 mg/kg/hr Intravenous To OR 08/04/2020, [facility]      . [START ON 06/04/2020] tranexamic acid (CYKLOKAPRON) bolus via infusion - over 30 minutes 1,848 mg  15 mg/kg Intravenous To OR 08/04/2020, [facility]      . [START ON 06/04/2020] tranexamic acid (CYKLOKAPRON) pump prime solution 246 mg  2 mg/kg Intracatheter To OR 08/04/2020, [facility]      . [START ON 06/04/2020] vancomycin (VANCOCIN) 1,000 mg in sodium chloride 0.9 % 1,000 mL irrigation   Irrigation To OR 08/04/2020, [facility]      . [START ON 06/04/2020] vancomycin (VANCOREADY) IVPB 1250 mg/250 mL  1,250 mg Intravenous To OR 08/04/2020 [facility]          Physical Exam:   Ht 5\' 10"  (1.778 m)   BMI 38.96 kg/m   General:  Well-appearing  Chest:   Clear to auscultation  CV:   Regular rate and rhythm with to and fro systolic and diastolic murmur  Incisions:  n/a  Abdomen:  Soft nontender  Extremities:  Warm and well-perfused  Diagnostic Tests:  CHEST - 2 VIEW  COMPARISON:  Radiograph 04/01/2014.  CT 05/01/2020  FINDINGS: The cardiomediastinal contours are normal. The lungs are clear. 6 mm nodule in the right lung on CT is not well seen by radiograph. Pulmonary vasculature is normal. No consolidation, pleural effusion, or pneumothorax. No acute osseous  abnormalities are seen.  IMPRESSION: No acute chest findings.   Electronically Signed   By: 04/03/2014 M.D.   On: 06/03/2020 14:39   Impression:  Patient has early stage D severe symptomatic aortic insufficiency.  He describes exertional shortness of breath that occurs with more strenuous physical exertion consistent with chronic diastolic congestive heart failure, New York Heart Association functional class I-II.  I have personally reviewed the patient's recent  transthoracic and transesophageal echocardiograms, diagnostic cardiac catheterization, CT angiogram, and pulmonary function tests.  Echocardiograms confirmed the presence of severe aortic insufficiency with dilated left ventricle and preserved left ventricular systolic function.  On TEE the aortic valve was clearly trileaflet with redundant leaflet tissue, type II dysfunction with prolapse of all 3 segments, and a dilated annulus.  Aortic insufficiency was described as "torrential" with pressure half-time measured 230 ms.  There was no aortic stenosis.  Left ventricle was moderately dilated.  Left ventricular ejection fraction was estimated 60 to 65%.  Gated CT angiogram of the heart confirmed the presence of trileaflet aortic valve with findings consistent with severe aortic insufficiency.  There was no aneurysmal dilatation of the aortic root or proximal ascending aorta.  There are no contraindications to peripheral cannulation for surgery.  CT scan also revealed incidental pulmonary nodule in the right middle lobe which will require follow-up CT in 6 to 12 months.  Pulmonary function testing revealed only mild changes of COPD.    Plan:  The patient and his wife were again counseled at length regarding treatment alternatives for management of severe aortic insufficiency including continued medical therapy versus proceeding with aortic valve replacement in the near future.  The natural history of aortic insufficiency was  reviewed, as was long term prognosis with medical therapy alone.  Surgical options were discussed at length including conventional surgical aortic valve replacement through either a full median sternotomy or using minimally invasive techniques.  Based upon review of the patient's transesophageal echocardiogram I do not recommend an attempt at valve repair.  Discussion was held comparing the relative risks of mechanical valve replacement with need for lifelong anticoagulation versus use of a bioprosthetic tissue valve and the associated potential for late structural valve deterioration and failure.  This discussion was placed in the context of the patient's particular circumstances, and as a result the patient specifically requests that their valve be replaced using a bioprosthetic tissue valve.  The patient understands and accepts all potential associated risks of surgery including but not limited to risk of death, stroke, myocardial infarction, congestive heart failure, respiratory failure, renal failure, pneumonia, bleeding requiring blood transfusion and or reexploration, arrhythmia, heart block or bradycardia requiring permanent pacemaker, aortic dissection or other major vascular complication, pleural effusions or other delayed complications related to continued congestive heart failure, and other late complications related to valve replacement including structural valve deterioration and failure, thrombosis, endocarditis, or paravalvular leak.  Alternative surgical approaches have been discussed including a comparison between conventional sternotomy and minimally-invasive techniques.  The relative risks and benefits of each have been reviewed as they pertain to the patient's specific circumstances, and all of their questions have been addressed.  Specific risks potentially related to the minimally-invasive approach were discussed at length, including but not limited to risk of conversion to full or partial  sternotomy, aortic dissection or other major vascular complication, unilateral acute lung injury or pulmonary edema, phrenic nerve dysfunction or paralysis, rib fracture, chronic pain, lung hernia, or lymphocele.  Furthermore, the patient understands that if placement of retrograde catheter utilized for cardioplegia and myocardial protection cannot be easily accomplished via minithoracotomy approach then we will plan to convert to a full conventional median sternotomy.  All questions have been answered.  We plan to proceed with surgery tomorrow as previously scheduled.    I spent in excess of 15 minutes during the conduct of this office consultation and >50% of this time involved direct face-to-face encounter with the patient for  counseling and/or coordination of their care.    Salvatore Decent. Cornelius Moras, MD 06/03/2020 4:29 PM

## 2020-06-03 NOTE — Anesthesia Preprocedure Evaluation (Addendum)
Anesthesia Evaluation  Patient identified by MRN, date of birth, ID band Patient awake    Reviewed: Allergy & Precautions, H&P , NPO status , Patient's Chart, lab work & pertinent test results, reviewed documented beta blocker date and time   Airway Mallampati: III  TM Distance: >3 FB Neck ROM: Full    Dental no notable dental hx. (+) Teeth Intact, Dental Advisory Given   Pulmonary sleep apnea and Continuous Positive Airway Pressure Ventilation , COPD, former smoker,    Pulmonary exam normal breath sounds clear to auscultation       Cardiovascular Exercise Tolerance: Good hypertension, Pt. on home beta blockers and Pt. on medications + Valvular Problems/Murmurs AI  Rhythm:Regular Rate:Normal     Neuro/Psych negative neurological ROS  negative psych ROS   GI/Hepatic Neg liver ROS, GERD  Medicated,  Endo/Other  Morbid obesity  Renal/GU negative Renal ROS  negative genitourinary   Musculoskeletal   Abdominal   Peds  Hematology negative hematology ROS (+)   Anesthesia Other Findings   Reproductive/Obstetrics negative OB ROS                            Anesthesia Physical Anesthesia Plan  ASA: IV  Anesthesia Plan: General   Post-op Pain Management:    Induction: Intravenous  PONV Risk Score and Plan: 2 and Ondansetron and Midazolam  Airway Management Planned: Double Lumen EBT  Additional Equipment: Arterial line, CVP, PA Cath, TEE, 3D TEE and Ultrasound Guidance Line Placement  Intra-op Plan:   Post-operative Plan: Post-operative intubation/ventilation  Informed Consent: I have reviewed the patients History and Physical, chart, labs and discussed the procedure including the risks, benefits and alternatives for the proposed anesthesia with the patient or authorized representative who has indicated his/her understanding and acceptance.     Dental advisory given  Plan Discussed  with: CRNA  Anesthesia Plan Comments:        Anesthesia Quick Evaluation

## 2020-06-03 NOTE — Patient Instructions (Signed)
  Have nothing to eat or drink after midnight the night before surgery.  On the morning of surgery take only Prevacid with a sip of water.  At your appointment for Pre-Admission Testing at the [facility] Short-Stay Department you were asked to sign permission forms for your upcoming surgery.  By definition your signature on these forms implies that you and/or your designee provide full informed consent for your planned surgical procedure(s), that alternative treatment options have been discussed, that you understand and accept any and all potential risks, and that you have some understanding of what to expect for your post-operative convalescence.  For elective aortic valve replacement potential operative risks include but are not limited to at least some risk of death, stroke or other neurologic complication, myocardial infarction, congestive heart failure, respiratory failure, renal failure, bleeding requiring transfusion and/or reexploration, arrhythmia, heart block or bradycardia requiring permanent pacemaker insertion, infection or other wound complications, pneumonia, pleural and/or pericardial effusion, pulmonary embolus, aortic dissection or other major vascular complication, or other immediate or delayed complications related to valve replacement including but not limited to recurrent aortic insufficiency, paravalvular leak, late structural valve deterioration and failure, thrombosis, embolization, or endocarditis.  Specific risks potentially related to the minimally-invasive approach include but are not limited to risk of conversion to full or partial sternotomy, aortic dissection or other major vascular complication, unilateral acute lung injury or pulmonary edema, phrenic nerve dysfunction or paralysis, rib fracture, chronic pain, lung hernia, or lymphocele.

## 2020-06-04 ENCOUNTER — Inpatient Hospital Stay (HOSPITAL_COMMUNITY): Payer: BC Managed Care – PPO | Admitting: Anesthesiology

## 2020-06-04 ENCOUNTER — Encounter (HOSPITAL_COMMUNITY)
Admission: RE | Disposition: A | Discharge status: Home / Self Care | Payer: Self-pay | Source: Home / Self Care | Attending: Thoracic Surgery (Cardiothoracic Vascular Surgery)

## 2020-06-04 ENCOUNTER — Encounter (HOSPITAL_COMMUNITY): Payer: Self-pay | Admitting: Thoracic Surgery (Cardiothoracic Vascular Surgery)

## 2020-06-04 ENCOUNTER — Inpatient Hospital Stay (HOSPITAL_COMMUNITY)
Admission: RE | Admit: 2020-06-04 | Discharge: 2020-06-08 | DRG: 220 | Disposition: A | Discharge status: Home / Self Care | Payer: BC Managed Care – PPO | Attending: Thoracic Surgery (Cardiothoracic Vascular Surgery) | Admitting: Thoracic Surgery (Cardiothoracic Vascular Surgery)

## 2020-06-04 ENCOUNTER — Inpatient Hospital Stay (HOSPITAL_COMMUNITY): Payer: BC Managed Care – PPO

## 2020-06-04 ENCOUNTER — Other Ambulatory Visit: Payer: Self-pay

## 2020-06-04 DIAGNOSIS — I10 Essential (primary) hypertension: Secondary | ICD-10-CM | POA: Diagnosis present

## 2020-06-04 DIAGNOSIS — Z809 Family history of malignant neoplasm, unspecified: Secondary | ICD-10-CM | POA: Diagnosis not present

## 2020-06-04 DIAGNOSIS — Z6841 Body Mass Index (BMI) 40.0 and over, adult: Secondary | ICD-10-CM | POA: Diagnosis not present

## 2020-06-04 DIAGNOSIS — Z952 Presence of prosthetic heart valve: Secondary | ICD-10-CM

## 2020-06-04 DIAGNOSIS — R911 Solitary pulmonary nodule: Secondary | ICD-10-CM | POA: Diagnosis present

## 2020-06-04 DIAGNOSIS — I2510 Atherosclerotic heart disease of native coronary artery without angina pectoris: Secondary | ICD-10-CM | POA: Diagnosis present

## 2020-06-04 DIAGNOSIS — I351 Nonrheumatic aortic (valve) insufficiency: Secondary | ICD-10-CM | POA: Diagnosis present

## 2020-06-04 DIAGNOSIS — J9811 Atelectasis: Secondary | ICD-10-CM

## 2020-06-04 DIAGNOSIS — E860 Dehydration: Secondary | ICD-10-CM | POA: Diagnosis present

## 2020-06-04 DIAGNOSIS — I110 Hypertensive heart disease with heart failure: Secondary | ICD-10-CM | POA: Diagnosis present

## 2020-06-04 DIAGNOSIS — E782 Mixed hyperlipidemia: Secondary | ICD-10-CM | POA: Diagnosis present

## 2020-06-04 DIAGNOSIS — I77810 Thoracic aortic ectasia: Secondary | ICD-10-CM | POA: Diagnosis present

## 2020-06-04 DIAGNOSIS — J439 Emphysema, unspecified: Secondary | ICD-10-CM | POA: Diagnosis present

## 2020-06-04 DIAGNOSIS — Z8249 Family history of ischemic heart disease and other diseases of the circulatory system: Secondary | ICD-10-CM | POA: Diagnosis not present

## 2020-06-04 DIAGNOSIS — I700 Atherosclerosis of aorta: Secondary | ICD-10-CM | POA: Diagnosis present

## 2020-06-04 DIAGNOSIS — G4733 Obstructive sleep apnea (adult) (pediatric): Secondary | ICD-10-CM | POA: Diagnosis present

## 2020-06-04 DIAGNOSIS — Z880 Allergy status to penicillin: Secondary | ICD-10-CM | POA: Diagnosis not present

## 2020-06-04 DIAGNOSIS — Z823 Family history of stroke: Secondary | ICD-10-CM

## 2020-06-04 DIAGNOSIS — I5032 Chronic diastolic (congestive) heart failure: Secondary | ICD-10-CM | POA: Diagnosis present

## 2020-06-04 DIAGNOSIS — Z79899 Other long term (current) drug therapy: Secondary | ICD-10-CM | POA: Diagnosis not present

## 2020-06-04 DIAGNOSIS — K219 Gastro-esophageal reflux disease without esophagitis: Secondary | ICD-10-CM | POA: Diagnosis present

## 2020-06-04 DIAGNOSIS — R011 Cardiac murmur, unspecified: Secondary | ICD-10-CM | POA: Diagnosis present

## 2020-06-04 DIAGNOSIS — Z87891 Personal history of nicotine dependence: Secondary | ICD-10-CM

## 2020-06-04 DIAGNOSIS — Z953 Presence of xenogenic heart valve: Secondary | ICD-10-CM

## 2020-06-04 DIAGNOSIS — E669 Obesity, unspecified: Secondary | ICD-10-CM | POA: Diagnosis present

## 2020-06-04 DIAGNOSIS — I371 Nonrheumatic pulmonary valve insufficiency: Secondary | ICD-10-CM | POA: Diagnosis present

## 2020-06-04 HISTORY — DX: Chronic diastolic (congestive) heart failure: I50.32

## 2020-06-04 HISTORY — DX: Presence of xenogenic heart valve: Z95.3

## 2020-06-04 HISTORY — PX: AORTIC VALVE REPLACEMENT: SHX41

## 2020-06-04 HISTORY — PX: TEE WITHOUT CARDIOVERSION: SHX5443

## 2020-06-04 HISTORY — DX: Presence of prosthetic heart valve: Z95.2

## 2020-06-04 LAB — CBC
HCT: 42.8 % (ref 39.0–52.0)
HCT: 40.8 % (ref 39.0–52.0)
Hemoglobin: 14.5 g/dL (ref 13.0–17.0)
Hemoglobin: 14.2 g/dL (ref 13.0–17.0)
MCH: 31.3 pg (ref 26.0–34.0)
MCH: 32.1 pg (ref 26.0–34.0)
MCHC: 33.9 g/dL (ref 30.0–36.0)
MCHC: 34.8 g/dL (ref 30.0–36.0)
MCV: 92.4 fL (ref 80.0–100.0)
MCV: 92.1 fL (ref 80.0–100.0)
Platelets: 129 10*3/uL — ABNORMAL LOW (ref 150–400)
Platelets: 123 10*3/uL — ABNORMAL LOW (ref 150–400)
RBC: 4.63 MIL/uL (ref 4.22–5.81)
RBC: 4.43 MIL/uL (ref 4.22–5.81)
RDW: 12.8 % (ref 11.5–15.5)
RDW: 12.9 % (ref 11.5–15.5)
WBC: 19.2 10*3/uL — ABNORMAL HIGH (ref 4.0–10.5)
WBC: 12.4 10*3/uL — ABNORMAL HIGH (ref 4.0–10.5)
nRBC: 0 % (ref 0.0–0.2)
nRBC: 0 % (ref 0.0–0.2)

## 2020-06-04 LAB — POCT I-STAT 7, (LYTES, BLD GAS, ICA,H+H)
Acid-Base Excess: 0 mmol/L (ref 0.0–2.0)
Acid-Base Excess: 2 mmol/L (ref 0.0–2.0)
Acid-base deficit: 1 mmol/L (ref 0.0–2.0)
Acid-base deficit: 1 mmol/L (ref 0.0–2.0)
Acid-base deficit: 3 mmol/L — ABNORMAL HIGH (ref 0.0–2.0)
Acid-base deficit: 3 mmol/L — ABNORMAL HIGH (ref 0.0–2.0)
Bicarbonate: 26.4 mmol/L (ref 20.0–28.0)
Bicarbonate: 25.5 mmol/L (ref 20.0–28.0)
Bicarbonate: 28 mmol/L (ref 20.0–28.0)
Bicarbonate: 25.3 mmol/L (ref 20.0–28.0)
Bicarbonate: 23.4 mmol/L (ref 20.0–28.0)
Bicarbonate: 22.8 mmol/L (ref 20.0–28.0)
Calcium, Ion: 1.02 mmol/L — ABNORMAL LOW (ref 1.15–1.40)
Calcium, Ion: 1.02 mmol/L — ABNORMAL LOW (ref 1.15–1.40)
Calcium, Ion: 1.12 mmol/L — ABNORMAL LOW (ref 1.15–1.40)
Calcium, Ion: 1.09 mmol/L — ABNORMAL LOW (ref 1.15–1.40)
Calcium, Ion: 1.03 mmol/L — ABNORMAL LOW (ref 1.15–1.40)
Calcium, Ion: 1.11 mmol/L — ABNORMAL LOW (ref 1.15–1.40)
HCT: 36 % — ABNORMAL LOW (ref 39.0–52.0)
HCT: 37 % — ABNORMAL LOW (ref 39.0–52.0)
HCT: 42 % (ref 39.0–52.0)
HCT: 37 % — ABNORMAL LOW (ref 39.0–52.0)
HCT: 37 % — ABNORMAL LOW (ref 39.0–52.0)
HCT: 42 % (ref 39.0–52.0)
Hemoglobin: 12.2 g/dL — ABNORMAL LOW (ref 13.0–17.0)
Hemoglobin: 12.6 g/dL — ABNORMAL LOW (ref 13.0–17.0)
Hemoglobin: 14.3 g/dL (ref 13.0–17.0)
Hemoglobin: 12.6 g/dL — ABNORMAL LOW (ref 13.0–17.0)
Hemoglobin: 12.6 g/dL — ABNORMAL LOW (ref 13.0–17.0)
Hemoglobin: 14.3 g/dL (ref 13.0–17.0)
O2 Saturation: 100 %
O2 Saturation: 100 %
O2 Saturation: 99 %
O2 Saturation: 100 %
O2 Saturation: 99 %
O2 Saturation: 98 %
Patient temperature: 35.9
Patient temperature: 35.4
Patient temperature: 35.9
Patient temperature: 36.7
Potassium: 5 mmol/L (ref 3.5–5.1)
Potassium: 5.6 mmol/L — ABNORMAL HIGH (ref 3.5–5.1)
Potassium: 4.6 mmol/L (ref 3.5–5.1)
Potassium: 4.5 mmol/L (ref 3.5–5.1)
Potassium: 4.1 mmol/L (ref 3.5–5.1)
Potassium: 4.3 mmol/L (ref 3.5–5.1)
Sodium: 135 mmol/L (ref 135–145)
Sodium: 135 mmol/L (ref 135–145)
Sodium: 138 mmol/L (ref 135–145)
Sodium: 139 mmol/L (ref 135–145)
Sodium: 141 mmol/L (ref 135–145)
Sodium: 139 mmol/L (ref 135–145)
TCO2: 28 mmol/L (ref 22–32)
TCO2: 27 mmol/L (ref 22–32)
TCO2: 30 mmol/L (ref 22–32)
TCO2: 27 mmol/L (ref 22–32)
TCO2: 25 mmol/L (ref 22–32)
TCO2: 24 mmol/L (ref 22–32)
pCO2 arterial: 50.9 mmHg — ABNORMAL HIGH (ref 32.0–48.0)
pCO2 arterial: 36.5 mmHg (ref 32.0–48.0)
pCO2 arterial: 63.8 mmHg — ABNORMAL HIGH (ref 32.0–48.0)
pCO2 arterial: 43.8 mmHg (ref 32.0–48.0)
pCO2 arterial: 43.4 mmHg (ref 32.0–48.0)
pCO2 arterial: 40.9 mmHg (ref 32.0–48.0)
pH, Arterial: 7.322 — ABNORMAL LOW (ref 7.350–7.450)
pH, Arterial: 7.452 — ABNORMAL HIGH (ref 7.350–7.450)
pH, Arterial: 7.244 — ABNORMAL LOW (ref 7.350–7.450)
pH, Arterial: 7.362 (ref 7.350–7.450)
pH, Arterial: 7.334 — ABNORMAL LOW (ref 7.350–7.450)
pH, Arterial: 7.352 (ref 7.350–7.450)
pO2, Arterial: 274 mmHg — ABNORMAL HIGH (ref 83.0–108.0)
pO2, Arterial: 315 mmHg — ABNORMAL HIGH (ref 83.0–108.0)
pO2, Arterial: 145 mmHg — ABNORMAL HIGH (ref 83.0–108.0)
pO2, Arterial: 177 mmHg — ABNORMAL HIGH (ref 83.0–108.0)
pO2, Arterial: 148 mmHg — ABNORMAL HIGH (ref 83.0–108.0)
pO2, Arterial: 108 mmHg (ref 83.0–108.0)

## 2020-06-04 LAB — POCT I-STAT, CHEM 8
BUN: 14 mg/dL (ref 6–20)
BUN: 13 mg/dL (ref 6–20)
BUN: 12 mg/dL (ref 6–20)
BUN: 12 mg/dL (ref 6–20)
Calcium, Ion: 1.27 mmol/L (ref 1.15–1.40)
Calcium, Ion: 1.22 mmol/L (ref 1.15–1.40)
Calcium, Ion: 1.08 mmol/L — ABNORMAL LOW (ref 1.15–1.40)
Calcium, Ion: 1.03 mmol/L — ABNORMAL LOW (ref 1.15–1.40)
Chloride: 102 mmol/L (ref 98–111)
Chloride: 105 mmol/L (ref 98–111)
Chloride: 99 mmol/L (ref 98–111)
Chloride: 102 mmol/L (ref 98–111)
Creatinine, Ser: 0.8 mg/dL (ref 0.61–1.24)
Creatinine, Ser: 0.6 mg/dL — ABNORMAL LOW (ref 0.61–1.24)
Creatinine, Ser: 0.7 mg/dL (ref 0.61–1.24)
Creatinine, Ser: 0.6 mg/dL — ABNORMAL LOW (ref 0.61–1.24)
Glucose, Bld: 113 mg/dL — ABNORMAL HIGH (ref 70–99)
Glucose, Bld: 104 mg/dL — ABNORMAL HIGH (ref 70–99)
Glucose, Bld: 141 mg/dL — ABNORMAL HIGH (ref 70–99)
Glucose, Bld: 136 mg/dL — ABNORMAL HIGH (ref 70–99)
HCT: 45 % (ref 39.0–52.0)
HCT: 43 % (ref 39.0–52.0)
HCT: 38 % — ABNORMAL LOW (ref 39.0–52.0)
HCT: 35 % — ABNORMAL LOW (ref 39.0–52.0)
Hemoglobin: 15.3 g/dL (ref 13.0–17.0)
Hemoglobin: 14.6 g/dL (ref 13.0–17.0)
Hemoglobin: 12.9 g/dL — ABNORMAL LOW (ref 13.0–17.0)
Hemoglobin: 11.9 g/dL — ABNORMAL LOW (ref 13.0–17.0)
Potassium: 4.2 mmol/L (ref 3.5–5.1)
Potassium: 4.4 mmol/L (ref 3.5–5.1)
Potassium: 5.5 mmol/L — ABNORMAL HIGH (ref 3.5–5.1)
Potassium: 5.2 mmol/L — ABNORMAL HIGH (ref 3.5–5.1)
Sodium: 140 mmol/L (ref 135–145)
Sodium: 139 mmol/L (ref 135–145)
Sodium: 137 mmol/L (ref 135–145)
Sodium: 137 mmol/L (ref 135–145)
TCO2: 30 mmol/L (ref 22–32)
TCO2: 26 mmol/L (ref 22–32)
TCO2: 27 mmol/L (ref 22–32)
TCO2: 28 mmol/L (ref 22–32)

## 2020-06-04 LAB — POCT I-STAT EG7
Acid-Base Excess: 0 mmol/L (ref 0.0–2.0)
Bicarbonate: 27.2 mmol/L (ref 20.0–28.0)
Calcium, Ion: 1.07 mmol/L — ABNORMAL LOW (ref 1.15–1.40)
HCT: 36 % — ABNORMAL LOW (ref 39.0–52.0)
Hemoglobin: 12.2 g/dL — ABNORMAL LOW (ref 13.0–17.0)
O2 Saturation: 75 %
Potassium: 5.5 mmol/L — ABNORMAL HIGH (ref 3.5–5.1)
Sodium: 136 mmol/L (ref 135–145)
TCO2: 29 mmol/L (ref 22–32)
pCO2, Ven: 54.9 mmHg (ref 44.0–60.0)
pH, Ven: 7.303 (ref 7.250–7.430)
pO2, Ven: 45 mmHg (ref 32.0–45.0)

## 2020-06-04 LAB — GLUCOSE, CAPILLARY
Glucose-Capillary: 124 mg/dL — ABNORMAL HIGH (ref 70–99)
Glucose-Capillary: 133 mg/dL — ABNORMAL HIGH (ref 70–99)
Glucose-Capillary: 111 mg/dL — ABNORMAL HIGH (ref 70–99)
Glucose-Capillary: 136 mg/dL — ABNORMAL HIGH (ref 70–99)
Glucose-Capillary: 77 mg/dL (ref 70–99)
Glucose-Capillary: 136 mg/dL — ABNORMAL HIGH (ref 70–99)
Glucose-Capillary: 172 mg/dL — ABNORMAL HIGH (ref 70–99)
Glucose-Capillary: 89 mg/dL (ref 70–99)

## 2020-06-04 LAB — BASIC METABOLIC PANEL
Anion gap: 7 (ref 5–15)
BUN: 12 mg/dL (ref 6–20)
CO2: 24 mmol/L (ref 22–32)
Calcium: 7.4 mg/dL — ABNORMAL LOW (ref 8.9–10.3)
Chloride: 106 mmol/L (ref 98–111)
Creatinine, Ser: 0.74 mg/dL (ref 0.61–1.24)
GFR, Estimated: >60 mL/min (ref >60)
Glucose, Bld: 135 mg/dL — ABNORMAL HIGH (ref 70–99)
Potassium: 4 mmol/L (ref 3.5–5.1)
Sodium: 137 mmol/L (ref 135–145)

## 2020-06-04 LAB — MAGNESIUM: Magnesium: 2.7 mg/dL — ABNORMAL HIGH (ref 1.7–2.4)

## 2020-06-04 LAB — HEMOGLOBIN AND HEMATOCRIT, BLOOD
HCT: 38.8 % — ABNORMAL LOW (ref 39.0–52.0)
Hemoglobin: 13.3 g/dL (ref 13.0–17.0)

## 2020-06-04 LAB — ECHO INTRAOPERATIVE TEE
AV Mean grad: 7 mmHg
AV Peak grad: 10.6 mmHg
Ao pk vel: 1.63 m/s
Height: 70 in
P 1/2 time: 248 msec
S' Lateral: 3.8 cm
Weight: 4345.71 oz

## 2020-06-04 LAB — PLATELET COUNT: Platelets: 149 10*3/uL — ABNORMAL LOW (ref 150–400)

## 2020-06-04 LAB — ABO/RH: ABO/RH(D): AB POS

## 2020-06-04 LAB — APTT: aPTT: 36 seconds (ref 24–36)

## 2020-06-04 LAB — PROTIME-INR
INR: 1.4 — ABNORMAL HIGH (ref 0.8–1.2)
Prothrombin Time: 17.4 seconds — ABNORMAL HIGH (ref 11.4–15.2)

## 2020-06-04 SURGERY — ECHOCARDIOGRAM, TRANSESOPHAGEAL
Anesthesia: General | Site: Chest

## 2020-06-04 MED ORDER — INSULIN REGULAR(HUMAN) IN NACL 100-0.9 UT/100ML-% IV SOLN: INTRAVENOUS | Status: DC

## 2020-06-04 MED ORDER — LACTATED RINGERS IV SOLN: INTRAVENOUS | Status: DC | PRN

## 2020-06-04 MED ORDER — PHENYLEPHRINE 40 MCG/ML (10ML) SYRINGE FOR IV PUSH (FOR BLOOD PRESSURE SUPPORT)
PREFILLED_SYRINGE | INTRAVENOUS | Status: DC | PRN
  Administered 2020-06-04 (×2): 40 ug via INTRAVENOUS

## 2020-06-04 MED ORDER — ALBUMIN HUMAN 5 % IV SOLN: INTRAVENOUS | Status: DC | PRN

## 2020-06-04 MED ORDER — SODIUM CHLORIDE 0.9 % IV SOLN: 250.0000 mL | INTRAVENOUS | Status: DC

## 2020-06-04 MED ORDER — SODIUM CHLORIDE 0.45 % IV SOLN: INTRAVENOUS | Status: DC | PRN

## 2020-06-04 MED ORDER — POTASSIUM CHLORIDE 10 MEQ/50ML IV SOLN
10.0000 meq | INTRAVENOUS | Status: AC
Start: 2020-06-04 — End: 2020-06-04

## 2020-06-04 MED ORDER — LINEZOLID 600 MG/300ML IV SOLN
600.0000 mg | Freq: Two times a day (BID) | INTRAVENOUS | Status: DC
  Administered 2020-06-04: 600 mg via INTRAVENOUS
  Filled 2020-06-04 (×3): qty 300

## 2020-06-04 MED ORDER — SODIUM CHLORIDE 0.9% FLUSH
10.0000 mL | Freq: Two times a day (BID) | INTRAVENOUS | Status: DC
  Administered 2020-06-04 – 2020-06-08 (×5): 10 mL

## 2020-06-04 MED ORDER — SODIUM CHLORIDE 0.9% FLUSH: 10.0000 mL | INTRAVENOUS | Status: DC | PRN

## 2020-06-04 MED ORDER — ACETAMINOPHEN 160 MG/5ML PO SOLN: 1000.0000 mg | Freq: Four times a day (QID) | ORAL | Status: DC

## 2020-06-04 MED ORDER — CEFAZOLIN SODIUM-DEXTROSE 2-4 GM/100ML-% IV SOLN
2.0000 g | Freq: Three times a day (TID) | INTRAVENOUS | Status: DC
  Administered 2020-06-04 – 2020-06-05 (×2): 2 g via INTRAVENOUS
  Filled 2020-06-04 (×6): qty 100

## 2020-06-04 MED ORDER — SODIUM CHLORIDE 0.9 % IV SOLN
1.5000 g | Freq: Two times a day (BID) | INTRAVENOUS | Status: DC
  Filled 2020-06-04 (×3): qty 1.5

## 2020-06-04 MED ORDER — HEPARIN SODIUM (PORCINE) 1000 UNIT/ML IJ SOLN
INTRAMUSCULAR | Status: DC | PRN
  Administered 2020-06-04: 37000 [IU] via INTRAVENOUS

## 2020-06-04 MED ORDER — CLEVIDIPINE BUTYRATE 0.5 MG/ML IV EMUL
1.0000 mg/h | INTRAVENOUS | Status: DC
  Administered 2020-06-04: 6 mg/h via INTRAVENOUS
  Administered 2020-06-04 – 2020-06-05 (×2): 1 mg/h via INTRAVENOUS
  Filled 2020-06-04 (×2): qty 100

## 2020-06-04 MED ORDER — PANTOPRAZOLE SODIUM 40 MG PO TBEC: 40.0000 mg | DELAYED_RELEASE_TABLET | Freq: Every day | ORAL | Status: DC

## 2020-06-04 MED ORDER — OXYCODONE HCL 5 MG PO TABS
5.0000 mg | ORAL_TABLET | ORAL | Status: DC | PRN
  Administered 2020-06-04 – 2020-06-05 (×2): 10 mg via ORAL
  Administered 2020-06-05: 5 mg via ORAL
  Filled 2020-06-04 (×2): qty 2
  Filled 2020-06-04: qty 1

## 2020-06-04 MED ORDER — ROCURONIUM BROMIDE 10 MG/ML (PF) SYRINGE
PREFILLED_SYRINGE | INTRAVENOUS | Status: AC
  Filled 2020-06-04: qty 10

## 2020-06-04 MED ORDER — ACETAMINOPHEN 160 MG/5ML PO SOLN: 650.0000 mg | Freq: Once | ORAL | Status: AC

## 2020-06-04 MED ORDER — SODIUM CHLORIDE 0.9% FLUSH: 3.0000 mL | INTRAVENOUS | Status: DC | PRN

## 2020-06-04 MED ORDER — MAGNESIUM SULFATE 4 GM/100ML IV SOLN
4.0000 g | Freq: Once | INTRAVENOUS | Status: AC
  Administered 2020-06-04: 4 g via INTRAVENOUS
  Filled 2020-06-04: qty 100

## 2020-06-04 MED ORDER — LACTATED RINGERS IV SOLN: INTRAVENOUS | Status: DC

## 2020-06-04 MED ORDER — SODIUM CHLORIDE 0.9 % IV SOLN: INTRAVENOUS | Status: AC

## 2020-06-04 MED ORDER — DOCUSATE SODIUM 100 MG PO CAPS
200.0000 mg | ORAL_CAPSULE | Freq: Every day | ORAL | Status: DC
  Administered 2020-06-05 – 2020-06-06 (×2): 200 mg via ORAL
  Filled 2020-06-04 (×4): qty 2

## 2020-06-04 MED ORDER — ONDANSETRON HCL 4 MG/2ML IJ SOLN
4.0000 mg | Freq: Four times a day (QID) | INTRAMUSCULAR | Status: DC | PRN
  Administered 2020-06-04: 4 mg via INTRAVENOUS
  Filled 2020-06-04: qty 2

## 2020-06-04 MED ORDER — PROTAMINE SULFATE 10 MG/ML IV SOLN
INTRAVENOUS | Status: AC
  Filled 2020-06-04: qty 10

## 2020-06-04 MED ORDER — PHENYLEPHRINE 40 MCG/ML (10ML) SYRINGE FOR IV PUSH (FOR BLOOD PRESSURE SUPPORT)
PREFILLED_SYRINGE | INTRAVENOUS | Status: AC
  Filled 2020-06-04: qty 10

## 2020-06-04 MED ORDER — HEPARIN SODIUM (PORCINE) 1000 UNIT/ML IJ SOLN
INTRAMUSCULAR | Status: AC
  Filled 2020-06-04: qty 1

## 2020-06-04 MED ORDER — PLASMA-LYTE 148 IV SOLN
INTRAVENOUS | Status: DC | PRN
  Administered 2020-06-04: 500 mL via INTRAVASCULAR

## 2020-06-04 MED ORDER — CLEVIDIPINE BUTYRATE 0.5 MG/ML IV EMUL
0.0000 mg/h | INTRAVENOUS | Status: DC
  Filled 2020-06-04: qty 50

## 2020-06-04 MED ORDER — PROPOFOL 10 MG/ML IV BOLUS
INTRAVENOUS | Status: AC
  Filled 2020-06-04: qty 20

## 2020-06-04 MED ORDER — PROPOFOL 10 MG/ML IV BOLUS
INTRAVENOUS | Status: DC | PRN
  Administered 2020-06-04: 30 mg via INTRAVENOUS
  Administered 2020-06-04: 20 mg via INTRAVENOUS
  Administered 2020-06-04: 50 mg via INTRAVENOUS
  Administered 2020-06-04: 30 mg via INTRAVENOUS
  Administered 2020-06-04: 50 mg via INTRAVENOUS
  Administered 2020-06-04: 40 mg via INTRAVENOUS

## 2020-06-04 MED ORDER — ORAL CARE MOUTH RINSE
15.0000 mL | OROMUCOSAL | Status: DC
  Administered 2020-06-04: 15 mL via OROMUCOSAL

## 2020-06-04 MED ORDER — ROCURONIUM BROMIDE 10 MG/ML (PF) SYRINGE
PREFILLED_SYRINGE | INTRAVENOUS | Status: DC | PRN
  Administered 2020-06-04: 40 mg via INTRAVENOUS
  Administered 2020-06-04 (×2): 20 mg via INTRAVENOUS
  Administered 2020-06-04: 30 mg via INTRAVENOUS
  Administered 2020-06-04: 70 mg via INTRAVENOUS
  Administered 2020-06-04: 40 mg via INTRAVENOUS

## 2020-06-04 MED ORDER — DEXMEDETOMIDINE HCL IN NACL 400 MCG/100ML IV SOLN
0.0000 ug/kg/h | INTRAVENOUS | Status: DC
  Administered 2020-06-04: 0.5 ug/kg/h via INTRAVENOUS
  Filled 2020-06-04: qty 100

## 2020-06-04 MED ORDER — FENTANYL CITRATE (PF) 250 MCG/5ML IJ SOLN
INTRAMUSCULAR | Status: AC
  Filled 2020-06-04: qty 5

## 2020-06-04 MED ORDER — ONDANSETRON HCL 4 MG/2ML IJ SOLN
INTRAMUSCULAR | Status: AC
  Filled 2020-06-04: qty 2

## 2020-06-04 MED ORDER — DEXTROSE 50 % IV SOLN: 0.0000 mL | INTRAVENOUS | Status: DC | PRN

## 2020-06-04 MED ORDER — CEFAZOLIN SODIUM-DEXTROSE 2-4 GM/100ML-% IV SOLN
2.0000 g | Freq: Four times a day (QID) | INTRAVENOUS | Status: DC
  Filled 2020-06-04: qty 100

## 2020-06-04 MED ORDER — ALBUMIN HUMAN 5 % IV SOLN
250.0000 mL | INTRAVENOUS | Status: DC | PRN
  Administered 2020-06-04 (×3): 12.5 g via INTRAVENOUS
  Filled 2020-06-04: qty 250

## 2020-06-04 MED ORDER — DIPHENHYDRAMINE HCL 50 MG/ML IJ SOLN
INTRAMUSCULAR | Status: AC
  Filled 2020-06-04: qty 1

## 2020-06-04 MED ORDER — MIDAZOLAM HCL 2 MG/2ML IJ SOLN: 2.0000 mg | INTRAMUSCULAR | Status: DC | PRN

## 2020-06-04 MED ORDER — MORPHINE SULFATE (PF) 2 MG/ML IV SOLN
1.0000 mg | INTRAVENOUS | Status: DC | PRN
  Administered 2020-06-04 – 2020-06-06 (×4): 2 mg via INTRAVENOUS
  Filled 2020-06-04 (×4): qty 1

## 2020-06-04 MED ORDER — CEFAZOLIN SODIUM-DEXTROSE 2-4 GM/100ML-% IV SOLN
2.0000 g | Freq: Three times a day (TID) | INTRAVENOUS | Status: DC
  Filled 2020-06-04: qty 100

## 2020-06-04 MED ORDER — SODIUM CHLORIDE (PF) 0.9 % IJ SOLN
OROMUCOSAL | Status: DC | PRN
  Administered 2020-06-04 (×5): 4 mL via TOPICAL

## 2020-06-04 MED ORDER — SODIUM CHLORIDE 0.9 % IV SOLN: INTRAVENOUS | Status: DC

## 2020-06-04 MED ORDER — NITROGLYCERIN IN D5W 200-5 MCG/ML-% IV SOLN: 0.0000 ug/min | INTRAVENOUS | Status: DC

## 2020-06-04 MED ORDER — LACTATED RINGERS IV SOLN: 500.0000 mL | Freq: Once | INTRAVENOUS | Status: DC | PRN

## 2020-06-04 MED ORDER — EPHEDRINE 5 MG/ML INJ
INTRAVENOUS | Status: AC
  Filled 2020-06-04: qty 10

## 2020-06-04 MED ORDER — CHLORHEXIDINE GLUCONATE CLOTH 2 % EX PADS
6.0000 | MEDICATED_PAD | Freq: Every day | CUTANEOUS | Status: DC
  Administered 2020-06-04 – 2020-06-08 (×4): 6 via TOPICAL

## 2020-06-04 MED ORDER — 0.9 % SODIUM CHLORIDE (POUR BTL) OPTIME
TOPICAL | Status: DC | PRN
  Administered 2020-06-04: 5000 mL

## 2020-06-04 MED ORDER — PROTAMINE SULFATE 10 MG/ML IV SOLN
INTRAVENOUS | Status: DC | PRN
  Administered 2020-06-04: 50 mg via INTRAVENOUS
  Administered 2020-06-04: 350 mg via INTRAVENOUS

## 2020-06-04 MED ORDER — TRAMADOL HCL 50 MG PO TABS
50.0000 mg | ORAL_TABLET | ORAL | Status: DC | PRN
  Administered 2020-06-04 – 2020-06-07 (×6): 100 mg via ORAL
  Filled 2020-06-04 (×6): qty 2

## 2020-06-04 MED ORDER — PROTAMINE SULFATE 10 MG/ML IV SOLN
INTRAVENOUS | Status: AC
  Filled 2020-06-04: qty 25

## 2020-06-04 MED ORDER — CLEVIDIPINE BUTYRATE 0.5 MG/ML IV EMUL
INTRAVENOUS | Status: DC | PRN
  Administered 2020-06-04: 2 mg/h via INTRAVENOUS

## 2020-06-04 MED ORDER — CHLORHEXIDINE GLUCONATE 0.12% ORAL RINSE (MEDLINE KIT): 15.0000 mL | Freq: Two times a day (BID) | OROMUCOSAL | Status: DC

## 2020-06-04 MED ORDER — BISACODYL 10 MG RE SUPP: 10.0000 mg | Freq: Every day | RECTAL | Status: DC

## 2020-06-04 MED ORDER — ACETAMINOPHEN 500 MG PO TABS
1000.0000 mg | ORAL_TABLET | Freq: Four times a day (QID) | ORAL | Status: DC
  Administered 2020-06-04 – 2020-06-08 (×14): 1000 mg via ORAL
  Filled 2020-06-04 (×14): qty 2

## 2020-06-04 MED ORDER — MIDAZOLAM HCL 5 MG/5ML IJ SOLN
INTRAMUSCULAR | Status: DC | PRN
  Administered 2020-06-04 (×4): 1 mg via INTRAVENOUS
  Administered 2020-06-04: 2 mg via INTRAVENOUS
  Administered 2020-06-04: 1 mg via INTRAVENOUS
  Administered 2020-06-04: 3 mg via INTRAVENOUS

## 2020-06-04 MED ORDER — SODIUM CHLORIDE 0.9% FLUSH
3.0000 mL | Freq: Two times a day (BID) | INTRAVENOUS | Status: DC
  Administered 2020-06-05 – 2020-06-08 (×6): 3 mL via INTRAVENOUS

## 2020-06-04 MED ORDER — FENTANYL CITRATE (PF) 250 MCG/5ML IJ SOLN
INTRAMUSCULAR | Status: DC | PRN
  Administered 2020-06-04: 50 ug via INTRAVENOUS
  Administered 2020-06-04: 100 ug via INTRAVENOUS
  Administered 2020-06-04: 50 ug via INTRAVENOUS
  Administered 2020-06-04: 450 ug via INTRAVENOUS
  Administered 2020-06-04 (×3): 50 ug via INTRAVENOUS
  Administered 2020-06-04 (×2): 100 ug via INTRAVENOUS
  Administered 2020-06-04: 150 ug via INTRAVENOUS
  Administered 2020-06-04: 100 ug via INTRAVENOUS

## 2020-06-04 MED ORDER — FAMOTIDINE IN NACL 20-0.9 MG/50ML-% IV SOLN
20.0000 mg | Freq: Two times a day (BID) | INTRAVENOUS | Status: AC
  Administered 2020-06-04 (×2): 20 mg via INTRAVENOUS
  Filled 2020-06-04 (×2): qty 50

## 2020-06-04 MED ORDER — PHENYLEPHRINE HCL-NACL 20-0.9 MG/250ML-% IV SOLN: 0.0000 ug/min | INTRAVENOUS | Status: DC

## 2020-06-04 MED ORDER — ALBUTEROL SULFATE HFA 108 (90 BASE) MCG/ACT IN AERS
2.0000 | INHALATION_SPRAY | Freq: Four times a day (QID) | RESPIRATORY_TRACT | Status: DC | PRN
  Filled 2020-06-04: qty 6.7

## 2020-06-04 MED ORDER — METOPROLOL TARTRATE 12.5 MG HALF TABLET
12.5000 mg | ORAL_TABLET | Freq: Once | ORAL | Status: AC
Start: 2020-06-04 — End: 2020-06-04
  Administered 2020-06-04: 12.5 mg via ORAL
  Filled 2020-06-04: qty 1

## 2020-06-04 MED ORDER — CHLORHEXIDINE GLUCONATE 0.12 % MT SOLN
15.0000 mL | Freq: Once | OROMUCOSAL | Status: AC
  Administered 2020-06-04: 15 mL via OROMUCOSAL
  Filled 2020-06-04: qty 15

## 2020-06-04 MED ORDER — ORAL CARE MOUTH RINSE
15.0000 mL | Freq: Two times a day (BID) | OROMUCOSAL | Status: DC
  Administered 2020-06-04 – 2020-06-08 (×6): 15 mL via OROMUCOSAL

## 2020-06-04 MED ORDER — VANCOMYCIN HCL IN DEXTROSE 1-5 GM/200ML-% IV SOLN
1000.0000 mg | Freq: Once | INTRAVENOUS | Status: DC
  Filled 2020-06-04: qty 200

## 2020-06-04 MED ORDER — DIPHENHYDRAMINE HCL 50 MG/ML IJ SOLN
INTRAMUSCULAR | Status: DC | PRN
  Administered 2020-06-04: 25 mg via INTRAVENOUS

## 2020-06-04 MED ORDER — PROTAMINE SULFATE 10 MG/ML IV SOLN
INTRAVENOUS | Status: AC
  Filled 2020-06-04: qty 5

## 2020-06-04 MED ORDER — MIDAZOLAM HCL (PF) 10 MG/2ML IJ SOLN
INTRAMUSCULAR | Status: AC
  Filled 2020-06-04: qty 2

## 2020-06-04 MED ORDER — ASPIRIN 81 MG PO CHEW: 324.0000 mg | CHEWABLE_TABLET | Freq: Every day | ORAL | Status: DC

## 2020-06-04 MED ORDER — CHLORHEXIDINE GLUCONATE 4 % EX LIQD: 30.0000 mL | CUTANEOUS | Status: DC

## 2020-06-04 MED ORDER — PRAVASTATIN SODIUM 10 MG PO TABS: 5.0000 mg | ORAL_TABLET | Freq: Every day | ORAL | Status: DC

## 2020-06-04 MED ORDER — SODIUM CHLORIDE 0.9% IV SOLUTION: Freq: Once | INTRAVENOUS | Status: DC

## 2020-06-04 MED ORDER — CHLORHEXIDINE GLUCONATE 0.12 % MT SOLN
15.0000 mL | OROMUCOSAL | Status: AC
  Administered 2020-06-04: 15 mL via OROMUCOSAL
  Filled 2020-06-04: qty 15

## 2020-06-04 MED ORDER — PSYLLIUM 95 % PO PACK
1.0000 | PACK | Freq: Every day | ORAL | Status: DC
  Administered 2020-06-05 – 2020-06-06 (×2): 1 via ORAL
  Filled 2020-06-04 (×5): qty 1

## 2020-06-04 MED ORDER — ACETAMINOPHEN 650 MG RE SUPP
650.0000 mg | Freq: Once | RECTAL | Status: AC
  Administered 2020-06-04: 650 mg via RECTAL

## 2020-06-04 MED ORDER — ASPIRIN EC 325 MG PO TBEC
325.0000 mg | DELAYED_RELEASE_TABLET | Freq: Every day | ORAL | Status: DC
  Administered 2020-06-05 – 2020-06-08 (×4): 325 mg via ORAL
  Filled 2020-06-04 (×4): qty 1

## 2020-06-04 MED ORDER — BISACODYL 5 MG PO TBEC
10.0000 mg | DELAYED_RELEASE_TABLET | Freq: Every day | ORAL | Status: DC
  Administered 2020-06-05 – 2020-06-06 (×2): 10 mg via ORAL
  Filled 2020-06-04 (×4): qty 2

## 2020-06-04 MED ORDER — ACETAMINOPHEN 500 MG PO TABS
1000.0000 mg | ORAL_TABLET | Freq: Once | ORAL | Status: AC
  Administered 2020-06-04: 1000 mg via ORAL
  Filled 2020-06-04: qty 2

## 2020-06-04 MED ORDER — ONDANSETRON HCL 4 MG/2ML IJ SOLN
INTRAMUSCULAR | Status: DC | PRN
  Administered 2020-06-04: 4 mg via INTRAVENOUS

## 2020-06-04 MED ORDER — PANTOPRAZOLE SODIUM 20 MG PO TBEC: 20.0000 mg | DELAYED_RELEASE_TABLET | Freq: Every day | ORAL | Status: DC

## 2020-06-04 SURGICAL SUPPLY — 119 items
ADAPTER CARDIO PERF ANTE/RETRO (ADAPTER) ×3 IMPLANT
BAG DECANTER FOR FLEXI CONT (MISCELLANEOUS) ×3 IMPLANT
BLADE CLIPPER SURG (BLADE) ×3 IMPLANT
BLADE SURG 11 STRL SS (BLADE) ×3 IMPLANT
CABLE PACING FASLOC BIEGE (MISCELLANEOUS) ×3 IMPLANT
CABLE PACING FASLOC BLUE (MISCELLANEOUS) ×3 IMPLANT
CANISTER SUCT 3000ML PPV (MISCELLANEOUS) ×3 IMPLANT
CANNULA ADULT BIO-MEDICUS 15FR (CANNULA) ×3 IMPLANT
CANNULA AORTIC ROOT 9FR (CANNULA) ×3 IMPLANT
CANNULA FEM BIOMEDICUS 25FR (CANNULA) ×3 IMPLANT
CANNULA GUNDRY RCSP 15FR (MISCELLANEOUS) ×3 IMPLANT
CANNULA OPTISITE PERFUSION 16F (CANNULA) IMPLANT
CANNULA OPTISITE PERFUSION 18F (CANNULA) IMPLANT
CANNULA SUMP PERICARDIAL (CANNULA) ×3 IMPLANT
CATH CPB KIT OWEN (MISCELLANEOUS) IMPLANT
CATH HEART VENT LEFT (CATHETERS) ×2 IMPLANT
CATH THORACIC 36FR (CATHETERS) ×3 IMPLANT
CELLS DAT CNTRL 66122 CELL SVR (MISCELLANEOUS) IMPLANT
CLOSURE PERCLOSE PROSTYLE (VASCULAR PRODUCTS) ×6 IMPLANT
CNTNR URN SCR LID CUP LEK RST (MISCELLANEOUS) ×2 IMPLANT
CONN 3/8X1/2 ST GISH (MISCELLANEOUS) ×3 IMPLANT
CONN ST 1/4X3/8  BEN (MISCELLANEOUS) ×2
CONN ST 1/4X3/8 BEN (MISCELLANEOUS) ×4 IMPLANT
CONNECTOR 1/2X3/8X1/2 3 WAY (MISCELLANEOUS) ×1
CONNECTOR 1/2X3/8X1/2 3WAY (MISCELLANEOUS) ×2 IMPLANT
CONT SPEC 4OZ STRL OR WHT (MISCELLANEOUS) ×1
CONTAINER PROTECT SURGISLUSH (MISCELLANEOUS) ×6 IMPLANT
COVER BACK TABLE 24X17X13 BIG (DRAPES) ×3 IMPLANT
COVER PROBE W GEL 5X96 (DRAPES) ×3 IMPLANT
DERMABOND ADVANCED (GAUZE/BANDAGES/DRESSINGS) ×2
DERMABOND ADVANCED .7 DNX12 (GAUZE/BANDAGES/DRESSINGS) ×4 IMPLANT
DEVICE SUT CK QUICK LOAD MINI (Prosthesis & Implant Heart) ×6 IMPLANT
DEVICE TROCAR PUNCTURE CLOSURE (ENDOMECHANICALS) IMPLANT
DRAIN CHANNEL 32F RND 10.7 FF (WOUND CARE) ×6 IMPLANT
DRAPE CV SPLIT W-CLR ANES SCRN (DRAPES) ×3 IMPLANT
DRAPE INCISE IOBAN 66X45 STRL (DRAPES) ×9 IMPLANT
DRAPE PERI GROIN 82X75IN TIB (DRAPES) ×3 IMPLANT
DRAPE TABLE BACK 80X90 (DRAPES) ×3 IMPLANT
DRAPE WARM FLUID 44X44 (DRAPES) ×3 IMPLANT
DRSG AQUACEL AG ADV 3.5X14 (GAUZE/BANDAGES/DRESSINGS) ×3 IMPLANT
ELECT BLADE 4.0 EZ CLEAN MEGAD (MISCELLANEOUS) ×3
ELECT BLADE 6.5 EXT (BLADE) ×3 IMPLANT
ELECT REM PT RETURN 9FT ADLT (ELECTROSURGICAL) ×6
ELECTRODE BLDE 4.0 EZ CLN MEGD (MISCELLANEOUS) ×2 IMPLANT
ELECTRODE REM PT RTRN 9FT ADLT (ELECTROSURGICAL) ×4 IMPLANT
FELT TEFLON 1X6 (MISCELLANEOUS) ×3 IMPLANT
FEMORAL VENOUS CANN RAP (CANNULA) IMPLANT
FIBERTAPE STERNAL CLSR 2 36IN (SUTURE) ×12 IMPLANT
FIBERTAPE STERNAL CLSR 2X36 (SUTURE) ×12 IMPLANT
GAUZE SPONGE 4X4 12PLY STRL (GAUZE/BANDAGES/DRESSINGS) ×3 IMPLANT
GLOVE ORTHO TXT STRL SZ7.5 (GLOVE) ×6 IMPLANT
GLOVE SURG LTX SZ6.5 (GLOVE) ×6 IMPLANT
GLOVE SURG POLYISO LF SZ6.5 (GLOVE) ×9 IMPLANT
GLOVE SURG UNDER POLY LF SZ6 (GLOVE) ×6 IMPLANT
GOWN STRL REUS W/ TWL LRG LVL3 (GOWN DISPOSABLE) ×18 IMPLANT
GOWN STRL REUS W/TWL LRG LVL3 (GOWN DISPOSABLE) ×9
GRASPER SUT TROCAR 14GX15 (MISCELLANEOUS) IMPLANT
HEMOSTAT POWDER SURGIFOAM 1G (HEMOSTASIS) ×15 IMPLANT
INSERT FOGARTY XLG (MISCELLANEOUS) ×3 IMPLANT
KIT BASIN OR (CUSTOM PROCEDURE TRAY) ×3 IMPLANT
KIT CATH SUCT 8FR (CATHETERS) ×3 IMPLANT
KIT DILATOR VASC 18G NDL (KITS) ×6 IMPLANT
KIT DRAINAGE VACCUM ASSIST (KITS) ×3 IMPLANT
KIT SUCTION CATH 14FR (SUCTIONS) ×3 IMPLANT
KIT SUT CK MINI COMBO 4X17 (Prosthesis & Implant Heart) ×3 IMPLANT
KIT TURNOVER KIT B (KITS) ×3 IMPLANT
LEAD PACING MYOCARDI (MISCELLANEOUS) ×3 IMPLANT
LINE VENT (MISCELLANEOUS) ×3 IMPLANT
NDL SUT PASSING CERCLAGE MED (SUTURE) ×6
NEEDLE AORTIC ROOT 14G 7F (CATHETERS) ×3 IMPLANT
NEEDLE SUT PASSING CERCLAG MED (SUTURE) ×4 IMPLANT
NS IRRIG 1000ML POUR BTL (IV SOLUTION) ×15 IMPLANT
PACK OPEN HEART (CUSTOM PROCEDURE TRAY) ×3 IMPLANT
PAD ARMBOARD 7.5X6 YLW CONV (MISCELLANEOUS) ×6 IMPLANT
PAD ELECT DEFIB RADIOL ZOLL (MISCELLANEOUS) ×3 IMPLANT
PENCIL BUTTON HOLSTER BLD 10FT (ELECTRODE) ×3 IMPLANT
POSITIONER HEAD DONUT 9IN (MISCELLANEOUS) ×3 IMPLANT
RTRCTR WOUND ALEXIS 18CM MED (MISCELLANEOUS)
SET CANNULATION TOURNIQUET (MISCELLANEOUS) ×3 IMPLANT
SET CARDIOPLEGIA MPS 5001102 (MISCELLANEOUS) ×3 IMPLANT
SET IRRIG TUBING LAPAROSCOPIC (IRRIGATION / IRRIGATOR) IMPLANT
SET MICROPUNCTURE 5F STIFF (MISCELLANEOUS) ×3 IMPLANT
SHEATH PINNACLE 8F 10CM (SHEATH) ×6 IMPLANT
SOL ANTI FOG 6CC (MISCELLANEOUS) ×2 IMPLANT
SOLUTION ANTI FOG 6CC (MISCELLANEOUS) ×1
SPONGE LAP 18X18 RF (DISPOSABLE) ×3 IMPLANT
SPONGE LAP 4X18 RFD (DISPOSABLE) ×3 IMPLANT
SUT BONE WAX W31G (SUTURE) ×3 IMPLANT
SUT EB EXC GRN/WHT 2-0 V-5 (SUTURE) ×9 IMPLANT
SUT ETHIBOND X763 2 0 SH 1 (SUTURE) ×9 IMPLANT
SUT GORETEX CV 4 TH 22 36 (SUTURE) IMPLANT
SUT GORETEX CV4 TH-18 (SUTURE) IMPLANT
SUT MNCRL AB 3-0 PS2 18 (SUTURE) ×6 IMPLANT
SUT PDS AB 1 CTX 36 (SUTURE) ×6 IMPLANT
SUT PROLENE 3 0 SH1 36 (SUTURE) ×12 IMPLANT
SUT PROLENE 4 0 RB 1 (SUTURE) ×4
SUT PROLENE 4-0 RB1 .5 CRCL 36 (SUTURE) ×8 IMPLANT
SUT PROLENE 6 0 C 1 30 (SUTURE) ×3 IMPLANT
SUT SILK  1 MH (SUTURE) ×1
SUT SILK 1 MH (SUTURE) ×2 IMPLANT
SUT SILK 2 0 SH CR/8 (SUTURE) ×3 IMPLANT
SUT TEM PAC WIRE 2 0 SH (SUTURE) ×6 IMPLANT
SWAB COLLECTION DEVICE MRSA (MISCELLANEOUS) ×3 IMPLANT
SWAB CULTURE ESWAB REG 1ML (MISCELLANEOUS) ×3 IMPLANT
SYSTEM SAHARA CHEST DRAIN ATS (WOUND CARE) ×3 IMPLANT
TAPE CLOTH SURG 4X10 WHT LF (GAUZE/BANDAGES/DRESSINGS) ×3 IMPLANT
TAPE PAPER 2X10 WHT MICROPORE (GAUZE/BANDAGES/DRESSINGS) ×3 IMPLANT
TOWEL GREEN STERILE (TOWEL DISPOSABLE) ×3 IMPLANT
TOWEL GREEN STERILE FF (TOWEL DISPOSABLE) ×3 IMPLANT
TRAY FOLEY SLVR 14FR TEMP STAT (SET/KITS/TRAYS/PACK) ×3 IMPLANT
TRAY FOLEY SLVR 16FR TEMP STAT (SET/KITS/TRAYS/PACK) IMPLANT
TROCAR XCEL BLADELESS 5X75MML (TROCAR) IMPLANT
TROCAR XCEL NON-BLD 11X100MML (ENDOMECHANICALS) IMPLANT
TUBE SUCT INTRACARD DLP 20F (MISCELLANEOUS) ×3 IMPLANT
UNDERPAD 30X36 HEAVY ABSORB (UNDERPADS AND DIAPERS) ×3 IMPLANT
VALVE AORTIC SZ27 INSP/RESIL (Valve) ×3 IMPLANT
VENT LEFT HEART 12002 (CATHETERS) ×3
WATER STERILE IRR 1000ML POUR (IV SOLUTION) ×6 IMPLANT
WIRE EMERALD 3MM-J .035X150CM (WIRE) ×3 IMPLANT

## 2020-06-04 NOTE — Anesthesia Procedure Notes (Signed)
Procedure Name: Intubation Date/Time: 06/04/2020 8:32 AM Performed by: Jed Limerick, CRNA Pre-anesthesia Checklist: Patient identified, Emergency Drugs available, Suction available and Patient being monitored Patient Re-evaluated:Patient Re-evaluated prior to induction Oxygen Delivery Method: Circle System Utilized Preoxygenation: Pre-oxygenation with 100% oxygen Induction Type: Inhalational induction with existing ETT Laryngoscope Size: Glidescope and 4 Grade View: Grade I Tube type: Oral Tube size: 8.0 mm Number of attempts: 1 Airway Equipment and Method: Video-laryngoscopy and Rigid stylet Placement Confirmation: ETT inserted through vocal cords under direct vision,  positive ETCO2 and breath sounds checked- equal and bilateral Secured at: 22 cm Tube secured with: Tape Dental Injury: Teeth and Oropharynx as per pre-operative assessment  Comments: Choice made to replace DLT d/t procedure switch to sternotomy vs minimally invasive

## 2020-06-04 NOTE — Anesthesia Procedure Notes (Signed)
Arterial Line Insertion Start/End5/04/2020 7:00 AM, 06/04/2020 7:08 AM Performed by: Gaynelle Adu, MD, Jannatul Wojdyla, Margurite Auerbach, CRNA, CRNA  Patient location: Pre-op. Preanesthetic checklist: patient identified, IV checked, site marked, risks and benefits discussed, surgical consent, monitors and equipment checked, pre-op evaluation, timeout performed and anesthesia consent Lidocaine 1% used for infiltration and patient sedated Left, radial was placed Catheter size: 20 G Hand hygiene performed , maximum sterile barriers used  and Seldinger technique used Allen's test indicative of satisfactory collateral circulation Attempts: 1 Procedure performed without using ultrasound guided technique. Following insertion, dressing applied and Biopatch. Post procedure assessment: normal  Patient tolerated the procedure well with no immediate complications.

## 2020-06-04 NOTE — Transfer of Care (Signed)
Immediate Anesthesia Transfer of Care Note  Patient: Torion Hulgan  Procedure(s) Performed: TRANSESOPHAGEAL ECHOCARDIOGRAM (TEE) (N/A ) AORTIC VALVE REPLACEMENT (AVR)  using Inspiris Valve size 23mm (N/A Chest)  Patient Location: ICU  Anesthesia Type:General  Level of Consciousness: sedated and Patient remains intubated per anesthesia plan  Airway & Oxygen Therapy: Patient remains intubated per anesthesia plan and Patient placed on Ventilator (see vital sign flow sheet for setting)  Post-op Assessment: Report given to RN and Post -op Vital signs reviewed and stable  Post vital signs: Reviewed and stable  Last Vitals:  Vitals Value Taken Time  BP 117/89 06/04/20 1325  Temp    Pulse 78 06/04/20 1327  Resp 13 06/04/20 1327  SpO2 100 % 06/04/20 1327  Vitals shown include unvalidated device data.  Last Pain:  Vitals:   06/04/20 0641  TempSrc:   PainSc: 0-No pain      Patients Stated Pain Goal: 4 (06/04/20 0641)  Complications: No complications documented.

## 2020-06-04 NOTE — Anesthesia Postprocedure Evaluation (Signed)
Anesthesia Post Note  Patient: Jackson Roberts  Procedure(s) Performed: TRANSESOPHAGEAL ECHOCARDIOGRAM (TEE) (N/A ) AORTIC VALVE REPLACEMENT (AVR)  using Inspiris Valve size 77mm (N/A Chest)     Patient location during evaluation: SICU Anesthesia Type: General Level of consciousness: sedated Pain management: pain level controlled Vital Signs Assessment: post-procedure vital signs reviewed and stable Respiratory status: patient remains intubated per anesthesia plan Cardiovascular status: stable Postop Assessment: no apparent nausea or vomiting Anesthetic complications: no   No complications documented.  Last Vitals:  Vitals:   06/04/20 0723 06/04/20 1329  BP:  114/78  Pulse: 78 79  Resp: 14   Temp:    SpO2: 98%     Last Pain:  Vitals:   06/04/20 0641  TempSrc:   PainSc: 0-No pain                 Layn Kye,W. EDMOND

## 2020-06-04 NOTE — Anesthesia Procedure Notes (Signed)
Procedure Name: Intubation Date/Time: 06/04/2020 8:02 AM Performed by: Candis Shine, CRNA Pre-anesthesia Checklist: Patient identified, Emergency Drugs available, Suction available and Patient being monitored Patient Re-evaluated:Patient Re-evaluated prior to induction Oxygen Delivery Method: Circle System Utilized Preoxygenation: Pre-oxygenation with 100% oxygen Induction Type: IV induction Ventilation: Oral airway inserted - appropriate to patient size and Two handed mask ventilation required Laryngoscope Size: Mac and 4 Grade View: Grade I Tube type: Oral Endobronchial tube: Left, Double lumen EBT and EBT position confirmed by auscultation and 39 Fr Number of attempts: 1 Airway Equipment and Method: Stylet and Oral airway Placement Confirmation: ETT inserted through vocal cords under direct vision,  positive ETCO2 and breath sounds checked- equal and bilateral Tube secured with: Tape Dental Injury: Teeth and Oropharynx as per pre-operative assessment  Comments: 11 F left vivasight used

## 2020-06-04 NOTE — Progress Notes (Signed)
RT attempted to perform recruitment maneuver. Pt did not tolerate well, pt's BP decreased to 88/65. RN made aware. Pt immediately placed back on previous settings.

## 2020-06-04 NOTE — Progress Notes (Signed)
Pharmacy note: antibiotics for surgical prophylaxis  55 yo male s/p AVR ordered vancomycin x1. He was noted with hives when vancomycin was given this morning.  Plan -discontinue vancomycin -Zyvox 600mg  IV x1 for surgical prophylaxis  , PharmD Clinical Pharmacist **Pharmacist phone directory can now be found on [email or web address] ([email or web address]).  Listed under [facility].

## 2020-06-04 NOTE — Plan of Care (Signed)
  Problem: Clinical Measurements: Goal: Diagnostic test results will improve Outcome: Progressing Goal: Respiratory complications will improve Outcome: Progressing Goal: Cardiovascular complication will be avoided Outcome: Progressing   Problem: Coping: Goal: Level of anxiety will decrease Outcome: Progressing   Problem: Elimination: Goal: Will not experience complications related to urinary retention Outcome: Progressing   Problem: Pain Managment: Goal: General experience of comfort will improve Outcome: Progressing   Problem: Cardiac: Goal: Will achieve and/or maintain hemodynamic stability Outcome: Progressing   Problem: Clinical Measurements: Goal: Postoperative complications will be avoided or minimized Outcome: Progressing   Problem: Respiratory: Goal: Respiratory status will improve Outcome: Progressing   Problem: Urinary Elimination: Goal: Ability to achieve and maintain adequate renal perfusion and functioning will improve Outcome: Progressing

## 2020-06-04 NOTE — Interval H&P Note (Signed)
History and Physical Interval Note:  06/04/2020 5:54 AM  Jackson Roberts  has presented today for surgery, with the diagnosis of AI.  The various methods of treatment have been discussed with the patient and family. After consideration of risks, benefits and other options for treatment, the patient has consented to  Procedure(s): MINIMALLY INVASIVE AORTIC VALVE REPLACEMENT (AVR) (N/A) TRANSESOPHAGEAL ECHOCARDIOGRAM (TEE) (N/A) as a surgical intervention.  The patient's history has been reviewed, patient examined, no change in status, stable for surgery.  I have reviewed the patient's chart and labs.  Questions were answered to the patient's satisfaction.     Purcell Nails

## 2020-06-04 NOTE — Anesthesia Procedure Notes (Signed)
Central Venous Catheter Insertion Performed by: Gaynelle Adu, MD, anesthesiologist Start/End5/04/2020 6:50 AM, 06/04/2020 7:05 AM Patient location: Pre-op. Preanesthetic checklist: patient identified, IV checked, site marked, risks and benefits discussed, surgical consent, monitors and equipment checked, pre-op evaluation, timeout performed and anesthesia consent Hand hygiene performed  and maximum sterile barriers used  PA cath was placed.Swan type:thermodilution PA Cath depth:50 Procedure performed without using ultrasound guided technique. Attempts: 1 Patient tolerated the procedure well with no immediate complications.

## 2020-06-04 NOTE — Op Note (Signed)
CARDIOTHORACIC SURGERY OPERATIVE NOTE  Date of Procedure:  06/04/2020  Preoperative Diagnosis: Severe Aortic Insufficiency  Postoperative Diagnosis: Same   Procedure:    Aortic Valve Replacement  Edwards Inspiris Resilia stented bovine pericardial tissue valve (size 27 mm, ref # [id number], serial # [id number])   Surgeon: Salvatore Decent. 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  Patient states that he was first noted to have a heart murmur approximately 5 or 6 years ago. He underwent echocardiogram and stress testing at that time and was found to have moderate to severe aortic insufficiency with preserved left ventricular systolic function. He has remained essentially asymptomatic and followed carefully ever since by Dr. Bing Matter. Recent follow-up echocardiogram revealed severe aortic insufficiency with preserved left ventricular systolic function but significant increase in left ventricular chamber size over the last 2 years. The patient subsequently underwent diagnostic cardiac catheterization which confirmed the presence of severe aortic insufficiency and revealed mild nonobstructive coronary artery disease with normal right heart pressures. Cardiothoracic surgical consultation was requested.  The patient has been seen in consultation and counseled at length regarding the indications, risks and potential benefits of surgery.  All  questions have been answered, and the patient provides full informed consent for the operation as described.    DETAILS OF THE OPERATIVE PROCEDURE  Preparation:  The patient is brought to the operating room on the above mentioned date and central monitoring was established by the anesthesia team including placement of Swan-Ganz catheter and radial arterial line. The patient is placed in the supine position on the operating table.  Intravenous antibiotics are administered. General endotracheal anesthesia is induced uneventfully. A Foley catheter is placed.  Baseline transesophageal echocardiogram was performed.  Findings were notable for trileaflet aortic valve with severe aortic insufficiency.  There appeared to be prolapse involving all 3 cusps of the aortic valve.  The annulus was relatively large in size but not pathologically dilated.  Left ventricular function was normal.  The left ventricle was mildly dilated.  No other abnormalities were noted.  The patient's chest, abdomen, both groins, and both lower extremities are prepared and draped in a sterile manner. A time out procedure is performed.   Percutaneous Vascular Access:  Percutaneous venous access were obtained on the right side.  Using ultrasound guidance the right common femoral vein was cannulated using the Seldinger technique and a pair of Perclose vascular closure devises were placed at opposing 30 degree angles, after which time an 8 French sheath inserted. The right internal jugular vein was cannulated  using ultrasound guidance and an 8 French sheath inserted.     Surgical Approach:  A median sternotomy incision was performed and the pericardium is opened. The ascending aorta is normal in appearance.    Extracorporeal Cardiopulmonary Bypass and Myocardial Protection:  The patient was heparinized systemically.  The distal ascending aorta is cannulated with a 20 French arterial cannula.  The right common femoral vein is  cannulated through the venous sheath and a guidewire advanced into the  right atrium using TEE guidance.  The femoral vein cannulated using a 25 Fr long femoral venous cannula.  The right internal jugular vein is cannulated through the venous sheath and a guidewire advanced into the right atrium.  The internal jugular vein is cannulated using a 15 [place] pediatric femoral venous cannula.   Adequate heparinization is verified.  A retrograde cardioplegia cannula is placed through the right atrium into the coronary sinus.  The operative field was continuously flooded with carbon dioxide gas.  The entire pre-bypass portion of the operation was notable for stable hemodynamics.  Cardiopulmonary bypass was begun and the surface of the heart is inspected.  A left ventricular vent is placed through the right superior pulmonary vein.  A temperature probe was placed in the interventricular septum.  The patient is cooled to 32C systemic temperature.  The aortic cross clamp is applied and cardioplegia is delivered initially retrograde through the coronary sinus using modified [place] cold blood cardioplegia ([place] blood cardioplegia protocol).   The initial cardioplegic arrest is rapid with early diastolic arrest.  After the patient achieves full cardiac arrest a low transverse aortotomy incision was performed and cardioplegia is administered antegrade directly into the left main and right coronary arteries using hand-held cannulas.  Myocardial protection was felt to be excellent.   Aortic Valve Replacement:  The aortic valve was inspected and noted to be trileaflet with prolapse involving all 3 leaflets of the valve.  All 3 commissures have large fenestrations which had fallen, creating fracture lines of prolapse in each of the 3 leaflets.  One area there is a portion of the valve that looks rough, possibly consistent with an old vegetation.  There is no sign of active endocarditis.  Swab cultures obtained.   The aortic valve leaflets were excised sharply.  There is no annular calcification.  The aortic annulus was sized to accept a 27 mm prosthesis.  The aortic root and left ventricle were irrigated with copious cold saline solution.  Aortic valve replacement was performed using interrupted horizontal mattress 2-0 Ethibond pledgeted sutures with pledgets in the subannular position.  An Edwards Inspiris Resilia stented bovine pericardial tissue valve (size 27 mm, ref # [id number], serial # [id number]) was implanted uneventfully. All sutures were secured using a Cor-knot device.  The valve seated appropriately with adequate space beneath the left main and right coronary artery.   Procedure Completion:  The aortotomy was closed using a 2-layer closure of running 4-0 Prolene suture.  One final dose of warm retrograde "reanimation dose" cardioplegia was administered retrograde through the coronary sinus catheter while all air was evacuated through the aortic root.  The aortic cross clamp was removed after a total cross clamp time of 80 minutes.  Epicardial pacing wires are fixed to the right ventricular outflow tract and to the right atrial appendage. The patient is rewarmed to 37C temperature. The aortic and left ventricular vents are removed.  The patient is weaned and disconnected from cardiopulmonary bypass.  The patient's rhythm at separation from bypass was sinus.  The patient was weaned from cardiopulmonary bypass without any inotropic support. Total cardiopulmonary bypass time for the operation was 101 minutes.  Followup transesophageal echocardiogram performed after separation from bypass revealed a well-seated aortic valve prosthesis that was functioning normally and without any sign of paravalvular leak.  Left ventricular function was unchanged from preoperatively.  The aortic and venous cannula were removed uneventfully. Protamine was administered to reverse the anticoagulation. The mediastinum and pleural  space were  inspected for hemostasis and irrigated with saline solution. The mediastinum was drained using 2 chest tubes placed through separate stab incisions inferiorly.  The soft tissues anterior to the aorta were reapproximated loosely. The sternum is closed with Fibertape cerclage. The soft tissues anterior to the sternum were closed in multiple layers and the skin is closed with a running subcuticular skin closure.  The post-bypass portion of the operation was notable for stable rhythm and hemodynamics.  No blood products were administered during the operation.   Disposition:  The patient tolerated the procedure well and is transported to the surgical intensive care in stable condition. There are no intraoperative complications. All sponge instrument and needle counts are verified correct at completion of the operation.    Salvatore Decent. Cornelius Moras MD 06/04/2020 12:48 PM

## 2020-06-04 NOTE — Progress Notes (Signed)
Patient ID: Jackson Roberts, male   DOB: March 26, 1965, 55 y.o.   MRN: [id number]  TCTS Evening Rounds:   Hemodynamically stable  CI = 3  Extubated  Urine output good  CT output low  CBC    Component Value Date/Time   WBC 19.2 (H) 06/04/2020 1346   RBC 4.63 06/04/2020 1346   HGB 14.5 06/04/2020 1346   HGB 18.1 (H) 05/06/2020 1144   HCT 42.8 06/04/2020 1346   HCT 52.5 (H) 05/06/2020 1144   PLT 129 (L) 06/04/2020 1346   PLT 204 05/06/2020 1144   MCV 92.4 06/04/2020 1346   MCV 91 05/06/2020 1144   MCH 31.3 06/04/2020 1346   MCHC 33.9 06/04/2020 1346   RDW 12.8 06/04/2020 1346   RDW 13.1 05/06/2020 1144     BMET    Component Value Date/Time   NA 137 06/04/2020 1201   NA 142 05/06/2020 1144   K 5.2 (H) 06/04/2020 1201   CL 102 06/04/2020 1201   CO2 24 05/31/2020 1600   GLUCOSE 136 (H) 06/04/2020 1201   BUN 12 06/04/2020 1201   BUN 11 05/06/2020 1144   CREATININE 0.60 (L) 06/04/2020 1201   CALCIUM 8.9 05/31/2020 1600   GFRNONAA >60 05/31/2020 1600   GFRAA 102 03/11/2020 0857     A/P:  Stable postop course. Continue current plans

## 2020-06-04 NOTE — Procedures (Signed)
Extubation Procedure Note  Patient Details:   Name: Jackson Roberts DOB: 09/10/1965 MRN: [id number]   Airway Documentation:  Airway 8 mm (Active)  Secured at (cm) 22 cm 06/04/20 1601  Measured From Lips 06/04/20 1601  Secured Location Right 06/04/20 1601  Secured By Pink Tape 06/04/20 1601  Prone position No 06/04/20 1601  Site Condition Dry 06/04/20 1601   Vent end date: (not recorded) Vent end time: (not recorded)   Evaluation  O2 sats: stable throughout Complications: No apparent complications Patient did tolerate procedure well. Bilateral Breath Sounds: Clear,Diminished   Yes   Patient extubated to 4lnc. Vital signs stable. No complications. NIF-35 VC-1.2L  Ave Filter 06/04/2020, 4:54 PM

## 2020-06-04 NOTE — Anesthesia Procedure Notes (Signed)
Central Venous Catheter Insertion Performed by: Roderic Palau, MD, anesthesiologist Start/End5/04/2020 6:50 AM, 06/04/2020 7:05 AM Patient location: Pre-op. Preanesthetic checklist: patient identified, IV checked, site marked, risks and benefits discussed, surgical consent, monitors and equipment checked, pre-op evaluation, timeout performed and anesthesia consent Position: Trendelenburg Lidocaine 1% used for infiltration and patient sedated Hand hygiene performed , maximum sterile barriers used  and Seldinger technique used Catheter size: 9 Fr Total catheter length 10. Central line was placed.MAC introducer Procedure performed using ultrasound guided technique. Ultrasound Notes:anatomy identified, needle tip was noted to be adjacent to the nerve/plexus identified, no ultrasound evidence of intravascular and/or intraneural injection and image(s) printed for medical record Attempts: 1 Following insertion, line sutured, dressing applied and Biopatch. Post procedure assessment: blood return through all ports, free fluid flow and no air  Patient tolerated the procedure well with no immediate complications.

## 2020-06-04 NOTE — Brief Op Note (Signed)
06/04/2020  11:29 AM  PATIENT:  Jackson Roberts  55 y.o. male  PRE-OPERATIVE DIAGNOSIS:  aortic insufficiency  POST-OPERATIVE DIAGNOSIS:  aortic insufficiency  PROCEDURE:  Procedure(s) with comments: TRANSESOPHAGEAL ECHOCARDIOGRAM (TEE) (N/A)  AORTIC VALVE REPLACEMENT (AVR)  using Inspiris Valve size 33mm (N/A) - Inspiris  SURGEON:  Surgeon(s) and Role:    Purcell Nails, MD - Primary  PHYSICIAN ASSISTANT: Lowella Dandy PA-C  ASSISTANTS: Benay Spice RNFA, Frutoso Schatz RNFA   ANESTHESIA:   general  EBL:  387 mL  BLOOD ADMINISTERED:  CELLSAVER  DRAINS: Mediastinal chest Drains   LOCAL MEDICATIONS USED:  NONE  SPECIMEN:  Source of Specimen:  Aortic Valve leaflets  DISPOSITION OF SPECIMEN:  Pathology, Microbiology  COUNTS:  YES  TOURNIQUET:  * No tourniquets in log *  DICTATION: .Dragon Dictation  PLAN OF CARE: Admit to inpatient   PATIENT DISPOSITION:  ICU - intubated and hemodynamically stable.   Delay start of Pharmacological VTE agent (>24hrs) due to surgical blood loss or risk of bleeding: yes

## 2020-06-04 NOTE — Progress Notes (Signed)
  Echocardiogram Echocardiogram Transesophageal has been performed.  Jackson Roberts 06/04/2020, 9:07 AM

## 2020-06-04 NOTE — Hospital Course (Addendum)
History of Present Illness:  Patient is 55 year old moderately obese male with history of hypertension, hyperlipidemia, and obstructive sleep apnea on CPAP who has been referred for surgical consultation to discuss treatment options for management of severe aortic insufficiency.   Patient states that he was first noted to have a heart murmur approximately 5 or 6 years ago.  He underwent echocardiogram and stress testing at that time and was found to have moderate to severe aortic insufficiency with preserved left ventricular systolic function.  He has remained essentially asymptomatic and followed carefully ever since by Dr. Bing Matter.  Recent follow-up echocardiogram revealed severe aortic insufficiency with preserved left ventricular systolic function but significant increase in left ventricular chamber size over the last 2 years.  The patient subsequently underwent diagnostic cardiac catheterization which confirmed the presence of severe aortic insufficiency and revealed mild nonobstructive coronary artery disease with normal right heart pressures.  Cardiothoracic surgical consultation was requested.  He was evaluated by Dr. Cornelius Moras at which time the patient admitted to living a somewhat sedentary lifestyle.  He [place]  He is able to take care of many chores including mowing a large amount of land during the growing season without significant limitation.  He does admit to longstanding history of poor energy and decreased exercise tolerance.  He admits that he gets short of breath when walking at a brisk pace with his wife.  He states that he simply cannot keep up with her because of exertional shortness of breath.  He denies shortness of breath with ordinary activities and he has never had any resting shortness of breath, PND, orthopnea, or lower extremity edema.  He has never had any exertional chest pain or chest tightness.  He had a brief dizzy spell  back in December but this has been attributed to dehydration.  It was felt the patient wound benefit from Aortic Valve Replacement.  The risks and benefits of the procedure were explained to the patient and he was agreeable to proceed.  Hospital Course:  The patient presented to [facility] on 06/04/2020.  He was taken to the operating room and underwent Aortic Valve Replacement with a 27 mm Edwards Inspiris Resilia Bioprosthetic valve.  He tolerated the procedure without difficulty and was taken to the SICU in stable condition. The patient was extubated the evening of surgery.  During his stay in the SICU the patient was weaned off Cleviprex as hemodynamics allowed.  He was started on lasix for mild hypervolemia.  He was restarted on home ARB for hypertension.  His chest tubes and arterial lines were removed without difficulty.  He remains in NSR and was felt stable for transfer to the progressive care unit on 06/06/2020.  The patient remains in NSR.  His pacing wires were removed without difficulty.  He is ambulating without difficulty.  His surgical incisions are healing without evidence of infection.  He is medically stable for discharge home today.

## 2020-06-04 NOTE — OR Nursing (Incomplete)
2nd call to SICU 1236.

## 2020-06-05 ENCOUNTER — Inpatient Hospital Stay (HOSPITAL_COMMUNITY): Payer: BC Managed Care – PPO

## 2020-06-05 ENCOUNTER — Encounter (HOSPITAL_COMMUNITY): Payer: Self-pay | Admitting: Thoracic Surgery (Cardiothoracic Vascular Surgery)

## 2020-06-05 LAB — BASIC METABOLIC PANEL
Anion gap: 8 (ref 5–15)
Anion gap: 8 (ref 5–15)
BUN: 10 mg/dL (ref 6–20)
BUN: 14 mg/dL (ref 6–20)
CO2: 24 mmol/L (ref 22–32)
CO2: 24 mmol/L (ref 22–32)
Calcium: 7.8 mg/dL — ABNORMAL LOW (ref 8.9–10.3)
Calcium: 8.2 mg/dL — ABNORMAL LOW (ref 8.9–10.3)
Chloride: 101 mmol/L (ref 98–111)
Chloride: 98 mmol/L (ref 98–111)
Creatinine, Ser: 0.78 mg/dL (ref 0.61–1.24)
Creatinine, Ser: 0.92 mg/dL (ref 0.61–1.24)
GFR, Estimated: >60 mL/min (ref >60)
GFR, Estimated: >60 mL/min (ref >60)
Glucose, Bld: 161 mg/dL — ABNORMAL HIGH (ref 70–99)
Glucose, Bld: 158 mg/dL — ABNORMAL HIGH (ref 70–99)
Potassium: 3.9 mmol/L (ref 3.5–5.1)
Potassium: 3.6 mmol/L (ref 3.5–5.1)
Sodium: 133 mmol/L — ABNORMAL LOW (ref 135–145)
Sodium: 130 mmol/L — ABNORMAL LOW (ref 135–145)

## 2020-06-05 LAB — MAGNESIUM
Magnesium: 2.1 mg/dL (ref 1.7–2.4)
Magnesium: 2.2 mg/dL (ref 1.7–2.4)

## 2020-06-05 LAB — CBC
HCT: 42.6 % (ref 39.0–52.0)
HCT: 43.5 % (ref 39.0–52.0)
Hemoglobin: 14.8 g/dL (ref 13.0–17.0)
Hemoglobin: 15 g/dL (ref 13.0–17.0)
MCH: 31.5 pg (ref 26.0–34.0)
MCH: 31.3 pg (ref 26.0–34.0)
MCHC: 34.7 g/dL (ref 30.0–36.0)
MCHC: 34.5 g/dL (ref 30.0–36.0)
MCV: 90.6 fL (ref 80.0–100.0)
MCV: 90.6 fL (ref 80.0–100.0)
Platelets: 144 10*3/uL — ABNORMAL LOW (ref 150–400)
Platelets: 144 10*3/uL — ABNORMAL LOW (ref 150–400)
RBC: 4.7 MIL/uL (ref 4.22–5.81)
RBC: 4.8 MIL/uL (ref 4.22–5.81)
RDW: 13 % (ref 11.5–15.5)
RDW: 13.2 % (ref 11.5–15.5)
WBC: 13.3 10*3/uL — ABNORMAL HIGH (ref 4.0–10.5)
WBC: 15.2 10*3/uL — ABNORMAL HIGH (ref 4.0–10.5)
nRBC: 0 % (ref 0.0–0.2)
nRBC: 0 % (ref 0.0–0.2)

## 2020-06-05 LAB — PREPARE FRESH FROZEN PLASMA: Unit division: 0

## 2020-06-05 LAB — GLUCOSE, CAPILLARY
Glucose-Capillary: 125 mg/dL — ABNORMAL HIGH (ref 70–99)
Glucose-Capillary: 129 mg/dL — ABNORMAL HIGH (ref 70–99)
Glucose-Capillary: 140 mg/dL — ABNORMAL HIGH (ref 70–99)
Glucose-Capillary: 140 mg/dL — ABNORMAL HIGH (ref 70–99)
Glucose-Capillary: 140 mg/dL — ABNORMAL HIGH (ref 70–99)
Glucose-Capillary: 141 mg/dL — ABNORMAL HIGH (ref 70–99)
Glucose-Capillary: 146 mg/dL — ABNORMAL HIGH (ref 70–99)
Glucose-Capillary: 151 mg/dL — ABNORMAL HIGH (ref 70–99)
Glucose-Capillary: 154 mg/dL — ABNORMAL HIGH (ref 70–99)
Glucose-Capillary: 156 mg/dL — ABNORMAL HIGH (ref 70–99)
Glucose-Capillary: 158 mg/dL — ABNORMAL HIGH (ref 70–99)
Glucose-Capillary: 165 mg/dL — ABNORMAL HIGH (ref 70–99)
Glucose-Capillary: 166 mg/dL — ABNORMAL HIGH (ref 70–99)
Glucose-Capillary: 168 mg/dL — ABNORMAL HIGH (ref 70–99)
Glucose-Capillary: 176 mg/dL — ABNORMAL HIGH (ref 70–99)

## 2020-06-05 LAB — SURGICAL PATHOLOGY

## 2020-06-05 LAB — BPAM FFP
Blood Product Expiration Date: 202205052359
Blood Product Expiration Date: 202205052359
ISSUE DATE / TIME: 202205031242
ISSUE DATE / TIME: 202205031242
Unit Type and Rh: 8400
Unit Type and Rh: 8400

## 2020-06-05 MED ORDER — INSULIN ASPART 100 UNIT/ML IJ SOLN
0.0000 [IU] | INTRAMUSCULAR | Status: DC
  Administered 2020-06-05 (×4): 2 [IU] via SUBCUTANEOUS

## 2020-06-05 MED ORDER — INSULIN ASPART 100 UNIT/ML IJ SOLN: 0.0000 [IU] | INTRAMUSCULAR | Status: DC

## 2020-06-05 MED ORDER — FUROSEMIDE 10 MG/ML IJ SOLN
20.0000 mg | Freq: Four times a day (QID) | INTRAMUSCULAR | Status: AC
  Administered 2020-06-05 (×3): 20 mg via INTRAVENOUS
  Filled 2020-06-05 (×3): qty 2

## 2020-06-05 MED ORDER — CEFAZOLIN SODIUM-DEXTROSE 2-4 GM/100ML-% IV SOLN
2.0000 g | Freq: Three times a day (TID) | INTRAVENOUS | Status: AC
  Administered 2020-06-05 – 2020-06-06 (×4): 2 g via INTRAVENOUS
  Filled 2020-06-05 (×4): qty 100

## 2020-06-05 MED ORDER — POTASSIUM CHLORIDE 10 MEQ/50ML IV SOLN
10.0000 meq | INTRAVENOUS | Status: AC
  Administered 2020-06-05 (×3): 10 meq via INTRAVENOUS
  Filled 2020-06-05 (×3): qty 50

## 2020-06-05 MED ORDER — METOPROLOL TARTRATE 25 MG PO TABS
25.0000 mg | ORAL_TABLET | Freq: Two times a day (BID) | ORAL | Status: DC
  Administered 2020-06-05 – 2020-06-08 (×7): 25 mg via ORAL
  Filled 2020-06-05 (×7): qty 1

## 2020-06-05 MED ORDER — PRAVASTATIN SODIUM 10 MG PO TABS
5.0000 mg | ORAL_TABLET | Freq: Every day | ORAL | Status: DC
  Administered 2020-06-08: 5 mg via ORAL
  Filled 2020-06-05: qty 1

## 2020-06-05 MED ORDER — KETOROLAC TROMETHAMINE 15 MG/ML IJ SOLN
15.0000 mg | Freq: Four times a day (QID) | INTRAMUSCULAR | Status: AC
  Administered 2020-06-05 – 2020-06-06 (×5): 15 mg via INTRAVENOUS
  Filled 2020-06-05 (×5): qty 1

## 2020-06-05 MED ORDER — IRBESARTAN 150 MG PO TABS
75.0000 mg | ORAL_TABLET | Freq: Every day | ORAL | Status: DC
  Administered 2020-06-05 – 2020-06-08 (×4): 75 mg via ORAL
  Filled 2020-06-05 (×4): qty 1

## 2020-06-05 MED ORDER — POTASSIUM CHLORIDE 10 MEQ/50ML IV SOLN
10.0000 meq | INTRAVENOUS | Status: AC
  Administered 2020-06-05 – 2020-06-06 (×5): 10 meq via INTRAVENOUS
  Filled 2020-06-05 (×4): qty 50

## 2020-06-05 MED ORDER — PANTOPRAZOLE SODIUM 40 MG PO TBEC
40.0000 mg | DELAYED_RELEASE_TABLET | Freq: Every day | ORAL | Status: DC
  Administered 2020-06-05 – 2020-06-08 (×4): 40 mg via ORAL
  Filled 2020-06-05 (×4): qty 1

## 2020-06-05 MED ORDER — ENOXAPARIN SODIUM 40 MG/0.4ML IJ SOSY
40.0000 mg | PREFILLED_SYRINGE | Freq: Every day | INTRAMUSCULAR | Status: DC
  Administered 2020-06-06 – 2020-06-07 (×2): 40 mg via SUBCUTANEOUS
  Filled 2020-06-05 (×2): qty 0.4

## 2020-06-05 MED FILL — Potassium Chloride Inj 2 mEq/ML: INTRAVENOUS | Qty: 40 | Status: AC

## 2020-06-05 MED FILL — Heparin Sodium (Porcine) Inj 1000 Unit/ML: INTRAMUSCULAR | Qty: 30 | Status: AC

## 2020-06-05 MED FILL — Lidocaine HCl Local Preservative Free (PF) Inj 2%: INTRAMUSCULAR | Qty: 15 | Status: AC

## 2020-06-05 NOTE — Progress Notes (Signed)
      [address]       [place] [address]             [phone]      Up in chair  BP 120/84   Pulse 82   Temp 99.1 F (37.3 C) (Oral)   Resp 11   Ht 5\' 10"  (1.778 m)   Wt 126.8 kg   SpO2 96%   BMI 40.10 kg/m  RA 97%  Intake/Output Summary (Last 24 hours) at 06/05/2020 1814 Last data filed at 06/05/2020 1400 Gross per 24 hour  Intake 1876.81 ml  Output 4930 ml  Net -3053.19 ml   Hct= 43 BMET pending  Doing well POD # 1  Jaylnn Ullery C. 08/05/2020, MD [facility] [phone]

## 2020-06-05 NOTE — Addendum Note (Signed)
Addendum  created 06/05/20 4128 by Adair Laundry, CRNA   Order list changed, Pharmacy for encounter modified

## 2020-06-05 NOTE — Progress Notes (Addendum)
[facility] DAILY ICU PROGRESS NOTE                   [address]            [name] [address]          [phone]   1 Day Post-Op Procedure(s) (LRB): TRANSESOPHAGEAL ECHOCARDIOGRAM (TEE) (N/A) AORTIC VALVE REPLACEMENT (AVR)  using Inspiris Valve size 17mm (N/A)  Total Length of Stay:  LOS: 1 day   Subjective: Some pain but coming under better control over rime  Objective: Vital signs in last 24 hours: Temp:  [95.7 F (35.4 C)-99.1 F (37.3 C)] 99.1 F (37.3 C) (05/04 0700) Pulse Rate:  [67-103] 101 (05/04 0700) Resp:  [0-22] 16 (05/04 0700) BP: (114-138)/(78-97) 136/88 (05/04 0700) SpO2:  [95 %-100 %] 96 % (05/04 0700) Arterial Line BP: (101-181)/(40-96) 136/69 (05/04 0700) FiO2 (%):  [40 %-50 %] 40 % (05/03 1601) Weight:  [126.8 kg] 126.8 kg (05/04 0500)  Filed Weights   06/04/20 0622 06/05/20 0500  Weight: 123.2 kg 126.8 kg    Weight change: 3.58 kg   Hemodynamic parameters for last 24 hours: PAP: (17-32)/(6-17) 17/6 CO:  [4.5 L/min-13.7 L/min] 6.9 L/min CI:  [1.9 L/min/m2-5.7 L/min/m2] 2.9 L/min/m2  Intake/Output from previous day: 05/03 0701 - 05/04 0700 In: 7008.1 [I.V.:4376.7; Blood:1361; IV Piggyback:1270.4] Out: 8095 [Urine:6140; Blood:1547; Chest Tube:408]  Intake/Output this shift: No intake/output data recorded.  Current Meds: Scheduled Meds: . sodium chloride   Intravenous Once  . acetaminophen  1,000 mg Oral Q6H  . aspirin EC  325 mg Oral Daily  . bisacodyl  10 mg Oral Daily   Or  . bisacodyl  10 mg Rectal Daily  . Chlorhexidine Gluconate Cloth  6 each Topical Daily  . docusate sodium  200 mg Oral Daily  . [START ON 06/06/2020] enoxaparin (LOVENOX) injection  40 mg Subcutaneous QHS  . furosemide  20 mg Intravenous Q6H  . insulin aspart  0-24 Units Subcutaneous Q4H  . irbesartan  75 mg Oral Daily  . mouth rinse  15 mL Mouth Rinse BID  . pantoprazole  40 mg Oral Daily  . [START ON 06/08/2020] pravastatin  5 mg Oral Daily  .  psyllium  1 packet Oral Daily  . sodium chloride flush  10-40 mL Intracatheter Q12H  . sodium chloride flush  3 mL Intravenous Q12H   Continuous Infusions: . sodium chloride    .  ceFAZolin (ANCEF) IV Stopped (06/05/20 0606)  . clevidipine 6 mg/hr (06/05/20 0700)  . lactated ringers    . lactated ringers    . nitroGLYCERIN 0 mcg/min (06/04/20 1325)  . potassium chloride 50 mL/hr at 06/05/20 0700   PRN Meds:.morphine injection, ondansetron (ZOFRAN) IV, oxyCODONE, sodium chloride flush, sodium chloride flush, traMADol  General appearance: alert, cooperative and no distress Neurologic: intact Heart: regular rate and rhythm Lungs: clear to auscultation bilaterally Abdomen: benign Extremities: minor edema Wound: dressings CDI  Lab Results: CBC: Recent Labs    06/04/20 1906 06/05/20 0425  WBC 12.4* 13.3*  HGB 14.2 14.8  HCT 40.8 42.6  PLT 123* 144*   BMET:  Recent Labs    06/04/20 1906 06/05/20 0425  NA 137 133*  K 4.0 3.9  CL 106 101  CO2 24 24  GLUCOSE 135* 161*  BUN 12 10  CREATININE 0.74 0.78  CALCIUM 7.4* 7.8*    CMET: Lab Results  Component Value Date   WBC 13.3 (H) 06/05/2020   HGB 14.8  06/05/2020   HCT 42.6 06/05/2020   PLT 144 (L) 06/05/2020   GLUCOSE 161 (H) 06/05/2020   ALT 44 05/31/2020   AST 23 05/31/2020   NA 133 (L) 06/05/2020   K 3.9 06/05/2020   CL 101 06/05/2020   CREATININE 0.78 06/05/2020   BUN 10 06/05/2020   CO2 24 06/05/2020   INR 1.4 (H) 06/04/2020   HGBA1C 5.6 05/31/2020      PT/INR:  Recent Labs    06/04/20 1346  LABPROT 17.4*  INR 1.4*   Radiology: [facility] Chest Port 1 View  Result Date: 06/05/2020 CLINICAL DATA:  Chest tube present. EXAM: PORTABLE CHEST 1 VIEW COMPARISON:  06/04/2020. FINDINGS: Interim extubation and removal of NG tube. Swan-Ganz catheter, mediastinal drainage catheters in stable position. Prior cardiac valve replacement. Stable cardiomegaly. Low lung volumes with mild right upper lobe and bibasilar  atelectasis. Mild interstitial prominence. Mild interstitial edema cannot be excluded. No pleural effusion or pneumothorax. IMPRESSION: 1. Interim extubation removal of NG tube. Remaining lines and tubes in stable position. 2.  Prior cardiac valve replacement.  Stable cardiomegaly. 3. Low lung volumes with mild right upper lobe and bibasilar atelectasis. Mild interstitial prominence. Mild interstitial edema cannot be excluded. Electronically Signed   By: Maisie Fus  Register   On: 06/05/2020 06:24   DG Chest Port 1 View  Result Date: 06/04/2020 CLINICAL DATA:  Postop day 0 status post aortic valve replacement with bioprosthetic valve for aortic insufficiency. EXAM: PORTABLE CHEST 1 VIEW COMPARISON:  05/31/2020 FINDINGS: Endotracheal tube tip 3.3 cm above the carina. Left internal jugular Swan-Ganz catheter tip is in the right pulmonary artery. Nasogastric tube extends into the stomach and beyond the inferior margin of today's image. Mediastinal drain projects over the cardiac shadow. Aortic valve prosthesis noted with expected orientation. Mildly low lung volumes. No pneumothorax. Suspected mild atelectasis in the right upper lobe medially. Mild effacement of the AP window, likely incidental in this postoperative setting. Mild perihilar interstitial accentuation without well-defined Kerley B lines, slightly greater on the left than the right, potentially related to pulmonary venous hypertension, correlate with Swan pressures. No blunting of the costophrenic angles. Cardiothoracic index 65%, somewhat elevated although partially this apparent cardiac enlargement may be attributable to the underlying low lung volumes which can exaggerate cardiac size. IMPRESSION: 1. Postop day 0 status post aortic valve prosthesis placement. No pneumothorax. Suspected mild atelectasis medially in the right upper lobe. Mild indistinctness of vasculature potentially from pulmonary venous hypertension, correlate with Swan-Ganz catheter  pressure measurements. Electronically Signed   By: Gaylyn Rong M.D.   On: 06/04/2020 14:12   ECHO INTRAOPERATIVE TEE  Result Date: 06/04/2020  *INTRAOPERATIVE TRANSESOPHAGEAL REPORT *  Patient Name:   Jackson Roberts Date of Exam: 06/04/2020 Medical Rec #:  [id number]           Height:       70.0 in Accession #:    [id number]          Weight:       271.6 lb Date of Birth:  1965/03/24          BSA:          2.38 m Patient Age:    54 years            BP:           160/60 mmHg Patient Gender: M                   HR:  79 bpm. Exam Location:  Inpatient Transesophogeal exam was perform intraoperatively during surgical procedure. Patient was closely monitored under general anesthesia during the entirety of examination. Indications:     aortic valve replacement Performing Phys: [id number] Jackson Roberts Diagnosing Phys: Jackson Roberts Complications: No known complications during this procedure. POST-OP IMPRESSIONS - Left Ventricle: The left ventricle is unchanged from pre-bypass. - Right Ventricle: The right ventricle appears unchanged from pre-bypass. - Aortic Valve: A bovine bioprosthetic valve was placed, leaflets are freely mobile and leaflets thin Size; 27mm. No regurgitation post repair. The gradient recorded across the prosthetic valve is within the expected range. No perivalvular leak noted. - Mitral Valve: The mitral valve appears unchanged from pre-bypass. - Tricuspid Valve: The tricuspid valve appears unchanged from pre-bypass. - Pulmonic Valve: The pulmonic valve appears unchanged from pre-bypass. PRE-OP FINDINGS  Left Ventricle: The left ventricle has normal systolic function, with an ejection fraction of 55-60%. The cavity size was normal. There is no increase in left ventricular wall thickness. No evidence of left ventricular regional wall motion abnormalities. Right Ventricle: The right ventricle has normal systolic function. The cavity was normal. There is no increase in right  ventricular wall thickness. Left Atrium: Left atrial size was not assessed. No left atrial/left atrial appendage thrombus was detected. Right Atrium: Right atrial size was not assessed. Interatrial Septum: No atrial level shunt detected by color flow Doppler. Pericardium: There is no evidence of pericardial effusion. Mitral Valve: The mitral valve is normal in structure. Mitral valve regurgitation is trivial by color flow Doppler. Tricuspid Valve: The tricuspid valve was normal in structure. Tricuspid valve regurgitation was not visualized by color flow Doppler. Aortic Valve: The aortic valve is tricuspid Aortic valve regurgitation is severe by color flow Doppler. The jet is eccentric anteriorly directed. There is no stenosis of the aortic valve. Pulmonic Valve: The pulmonic valve was normal in structure. Pulmonic valve regurgitation is not visualized by color flow Doppler. +--------------+--------++ LEFT VENTRICLE         +--------------+--------++ PLAX 2D                +--------------+--------++ LVIDd:        5.60 cm  +--------------+--------++ LVIDs:        3.80 cm  +--------------+--------++ LVOT diam:    3.30 cm  +--------------+--------++ LV SV:        92 ml    +--------------+--------++ LV SV Index:  36.41    +--------------+--------++ LVOT Area:    8.55 cm +--------------+--------++                        +--------------+--------++ +-------------+------------++ AORTIC VALVE              +-------------+------------++ AV Vmax:     163.00 cm/s  +-------------+------------++ AV Vmean:    125.000 cm/s +-------------+------------++ AV VTI:      0.306 m      +-------------+------------++ AV Peak Grad:10.6 mmHg    +-------------+------------++ AV Mean Grad:7.0 mmHg     +-------------+------------++ AR PHT:      248 msec     +-------------+------------++  +--------------+-------+ SHUNTS                +--------------+-------+ Systemic Diam:3.30  cm +--------------+-------+  Jackson Roberts Electronically signed by Jackson Roberts Signature Date/Time: 06/04/2020/2:20:53 PM    Final      Assessment/Plan: S/P Procedure(s) (LRB): TRANSESOPHAGEAL ECHOCARDIOGRAM (TEE) (N/A) AORTIC VALVE REPLACEMENT (AVR)  using Inspiris Valve size 27mm (  N/A)  1 afeb, Stable hemodynamics, some HTN per a-line - d/c SGANZ and A-line. Start ARB, wean cleviprex off 2 sats good on 4 liters [place] 3 excellent UOP, normal renal fxn, weight about 3 KG over preop- starting low dose lasix 4 408 cc CT drainage/post-op , leave for now, CXR with some interstitial prominence and atx 5 minor leukocytosis, prob systemic inflammatory response 6 not anemic, minor thrombocytopenia with improving trend, Lovenox for DVT PPx. Bioprosthetic valve 7 well controlled sugars- d/c insulin gtt, HgA1C 5.6 on no meds- routine management for non-diabetic 8 routine rehab/pulm toilet     Jackson Roberts Pager [phone] 06/05/2020 7:44 AM    I have seen and examined the patient and agree with the assessment and plan as outlined.  Doing well POD1.  Maintaining NSR w/ stable hemodynamics although still on cleviprex for hypertension.  Mobilize.  Diuresis.  Add metoprolol and restart home ARB.  D/C tubes later today or tomorrow, depending on output  Jackson Nails, Roberts 06/05/2020 8:30 AM

## 2020-06-06 ENCOUNTER — Inpatient Hospital Stay (HOSPITAL_COMMUNITY): Payer: BC Managed Care – PPO

## 2020-06-06 LAB — AEROBIC CULTURE W GRAM STAIN (SUPERFICIAL SPECIMEN)
Culture: NO GROWTH
Gram Stain: NONE SEEN

## 2020-06-06 LAB — BASIC METABOLIC PANEL
Anion gap: 7 (ref 5–15)
BUN: 12 mg/dL (ref 6–20)
CO2: 26 mmol/L (ref 22–32)
Calcium: 8.3 mg/dL — ABNORMAL LOW (ref 8.9–10.3)
Chloride: 97 mmol/L — ABNORMAL LOW (ref 98–111)
Creatinine, Ser: 0.78 mg/dL (ref 0.61–1.24)
GFR, Estimated: >60 mL/min (ref >60)
Glucose, Bld: 109 mg/dL — ABNORMAL HIGH (ref 70–99)
Potassium: 3.8 mmol/L (ref 3.5–5.1)
Sodium: 130 mmol/L — ABNORMAL LOW (ref 135–145)

## 2020-06-06 LAB — CBC
HCT: 40 % (ref 39.0–52.0)
Hemoglobin: 13.7 g/dL (ref 13.0–17.0)
MCH: 31.6 pg (ref 26.0–34.0)
MCHC: 34.3 g/dL (ref 30.0–36.0)
MCV: 92.2 fL (ref 80.0–100.0)
Platelets: 120 10*3/uL — ABNORMAL LOW (ref 150–400)
RBC: 4.34 MIL/uL (ref 4.22–5.81)
RDW: 13.2 % (ref 11.5–15.5)
WBC: 11.1 10*3/uL — ABNORMAL HIGH (ref 4.0–10.5)
nRBC: 0 % (ref 0.0–0.2)

## 2020-06-06 LAB — GLUCOSE, CAPILLARY: Glucose-Capillary: 106 mg/dL — ABNORMAL HIGH (ref 70–99)

## 2020-06-06 MED ORDER — FUROSEMIDE 10 MG/ML IJ SOLN
40.0000 mg | Freq: Once | INTRAMUSCULAR | Status: AC
  Administered 2020-06-06: 40 mg via INTRAVENOUS
  Filled 2020-06-06: qty 4

## 2020-06-06 MED ORDER — FUROSEMIDE 40 MG PO TABS
40.0000 mg | ORAL_TABLET | Freq: Every day | ORAL | Status: AC
  Administered 2020-06-07 – 2020-06-08 (×2): 40 mg via ORAL
  Filled 2020-06-06 (×2): qty 1

## 2020-06-06 MED ORDER — POTASSIUM CHLORIDE 10 MEQ/50ML IV SOLN
10.0000 meq | INTRAVENOUS | Status: AC
  Administered 2020-06-06 (×3): 10 meq via INTRAVENOUS
  Filled 2020-06-06: qty 50

## 2020-06-06 MED ORDER — ~~LOC~~ CARDIAC SURGERY, PATIENT & FAMILY EDUCATION: Freq: Once | Status: AC

## 2020-06-06 MED ORDER — POTASSIUM CHLORIDE CRYS ER 20 MEQ PO TBCR
20.0000 meq | EXTENDED_RELEASE_TABLET | Freq: Every day | ORAL | Status: AC
  Administered 2020-06-06 – 2020-06-08 (×3): 20 meq via ORAL
  Filled 2020-06-06 (×3): qty 1

## 2020-06-06 NOTE — Discharge Instructions (Signed)

## 2020-06-06 NOTE — Progress Notes (Signed)
CARDIAC REHAB PHASE I   PRE:  Rate/Rhythm: 87 SR  BP:  Sitting: 121/85      SaO2: 98 RA  MODE:  Ambulation: 470 ft   POST:  Rate/Rhythm: 102 ST  BP:  Sitting: 127/86    SaO2: 100 RA   Pt helped to BR than ambulated 468ft in hallway independently with steady gait. Pt with some SOB, coached through purse lipped breathing. Pt returned to chair. Encouraged continued IS use and walks. Pt denies questions or concerns at this time. Call bell and bedside table within reach. Will continue to follow.  [id number] Reynold Bowen, RN BSN 06/06/2020 2:44 PM

## 2020-06-06 NOTE — Progress Notes (Signed)
CARDIAC REHAB PHASE I   Went to offer to walk with pt. Pt just received lunch. Eager to walk after finished eating. Will return to ambulate.  Reynold Bowen, RN BSN 06/06/2020 11:21 AM

## 2020-06-06 NOTE — Progress Notes (Signed)
Mobility Specialist - Progress Note   06/06/20 1630  Mobility  Activity Ambulated in hall  Level of Assistance Independent  Assistive Device None  Distance Ambulated (ft) 430 ft  Mobility Response Tolerated well  Mobility performed by Mobility specialist  $Mobility charge 1 Mobility   Pre-mobility: 88 HR During mobility: 104 HR Post-mobility: 92 HR  Pt asx throughout ambulation. Pt to bathroom, then to bed after walk.   Mamie Levers Mobility Specialist Mobility Specialist Phone: [phone]

## 2020-06-06 NOTE — Discharge Summary (Addendum)
[address]       [place] [address]             [phone]    Physician Discharge Summary  Patient ID: Jackson Roberts MRN: [id number] DOB/AGE: 57-16-1967 57 y.o.  Admit date: 06/04/2020 Discharge date: 06/08/2020  Admission Diagnoses:  Patient Active Problem List   Diagnosis Date Noted  . Chronic diastolic congestive heart failure (HCC)   . Incidental pulmonary nodule, > 29mm and < 41mm 05/01/2020  . Dizziness 01/17/2020  . Essential hypertension 03/04/2017  . Mixed hyperlipidemia 03/04/2017  . 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 Plantar fasciitis of right foot 07/17/2015  . Dyslipidemia 05/07/2015  . Nonrheumatic aortic valve insufficiency 05/07/2015   Discharge Diagnoses:  Patient Active Problem List   Diagnosis Date Noted  . S/P aortic valve replacement with bioprosthetic valve 06/04/2020  . S/P AVR (aortic valve replacement) 06/04/2020  . Chronic diastolic congestive heart failure (HCC)   . Incidental pulmonary nodule, > 53mm and < 50mm 05/01/2020  . Dizziness 01/17/2020  . Essential hypertension 03/04/2017  . Mixed hyperlipidemia 03/04/2017  . 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 Plantar fasciitis of right foot 07/17/2015  . Dyslipidemia 05/07/2015  . Nonrheumatic aortic valve insufficiency 05/07/2015   Discharged Condition: good  History of Present Illness:  Patient is 55 year old moderately obese male with history of hypertension, hyperlipidemia, and obstructive sleep apnea on CPAP who has been referred for surgical consultation to discuss treatment options for management of severe aortic insufficiency.   Patient states that he was first noted to have a heart murmur approximately 5 or 6 years ago.  He underwent echocardiogram and stress testing at that time and was found to have moderate to severe aortic insufficiency with preserved left ventricular systolic function.  He has remained essentially asymptomatic and followed  carefully ever since by Dr. Bing Matter.  Recent follow-up echocardiogram revealed severe aortic insufficiency with preserved left ventricular systolic function but significant increase in left ventricular chamber size over the last 2 years.  The patient subsequently underwent diagnostic cardiac catheterization which confirmed the presence of severe aortic insufficiency and revealed mild nonobstructive coronary artery disease with normal right heart pressures.  Cardiothoracic surgical consultation was requested.  He was evaluated by Dr. Cornelius Moras at which time the patient admitted to living a somewhat sedentary lifestyle.  [place]  He is able to take care of many chores including mowing a large amount of land during the growing season without significant limitation.  He does admit to longstanding history of poor energy and decreased exercise tolerance.  He admits that he gets short of breath when walking at a brisk pace with his wife.  He states that he simply cannot keep up with her because of exertional shortness of breath.  He denies shortness of breath with ordinary activities and he has never had any resting shortness of breath, PND, orthopnea, or lower extremity edema.  He has never had any exertional chest pain or chest tightness.  He had a brief dizzy spell back in December but this has been attributed to dehydration.  It was felt the patient wound benefit from Aortic Valve Replacement.  The risks and benefits of the procedure were explained to the patient and he was agreeable to proceed.  Hospital Course:  The patient presented to [facility] on 06/04/2020.  He was taken to the operating room and  underwent Aortic Valve Replacement with a 27 mm Edwards Inspiris Resilia Bioprosthetic valve.  He tolerated the procedure without difficulty and was taken to the SICU in stable condition. The patient was extubated the evening of surgery.  During his stay  in the SICU the patient was weaned off Cleviprex as hemodynamics allowed.  He was started on lasix for mild hypervolemia.  He was restarted on home ARB for hypertension.  His chest tubes and arterial lines were removed without difficulty.  He remains in NSR and was felt stable for transfer to the progressive care unit on 06/06/2020.  The patient remains in NSR.  His pacing wires were removed without difficulty.  He is ambulating without difficulty.  His surgical incisions are healing without evidence of infection.  He is medically stable for discharge home today.  Significant Diagnostic Studies: cardiac graphics:   Echocardiogram:   IMPRESSIONS    1. Left ventricular ejection fraction, by estimation, is 60 to 65%. The  left ventricle has normal function. The left ventricle has no regional  wall motion abnormalities. The left ventricular internal cavity size was  moderately dilated. Left ventricular  diastolic parameters were normal.  2. Right ventricular systolic function is normal. The right ventricular  size is normal.  3. No left atrial/left atrial appendage thrombus was detected.  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 Trivial mitral valve regurgitation.  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  5. All three aortic cusps appear redundant and exhibit diastolic  prolapse. The aortic valve is tricuspid. Aortic valve regurgitation is  severe. No aortic stenosis is present. Aortic regurgitation PHT measures  230 msec.  6. There is borderline dilatation of the aortic root, measuring 39 mm.    Treatments: surgery:   Date of Procedure:                06/04/2020  Preoperative Diagnosis:      Severe Aortic Insufficiency  Postoperative Diagnosis:    Same   Procedure:        Aortic Valve Replacement             Edwards Inspiris Resilia stented bovine pericardial tissue valve (size 27 mm, ref # [id number], serial # [id number])              Surgeon:         Salvatore Decent. Cornelius Moras, MD  Assistant:       Lowella Dandy, PA-C   Discharge Exam: Blood pressure (!) 154/109, pulse 64, temperature 97.7 F (36.5 C), temperature source Oral, resp. rate 15, height 5\' 10"  (1.778 m), weight 124.8 kg, SpO2 97 %.  General appearance: alert, cooperative and no distress Heart: regular rate and rhythm Lungs: clear to auscultation bilaterally Abdomen: soft, non-tender; bowel sounds normal; no masses,  no organomegaly Extremities: edema minimal Wound: clean and dry  Discharge Medications:  The patient has been discharged on:   1.Beta Blocker:  Yes [ X  ]                              No   [   ]                              If No, reason:  2.Ace Inhibitor/ARB: Yes [ X  ]  No  [    ]                                     If No, reason:  3.Statin:   Yes [ X  ]                  No  [   ]                  If No, reason:  4.Ecasa:  Yes  [ X  ]                  No   [   ]                  If No, reason:  Discharge Instructions    Amb Referral to Cardiac Rehabilitation   Complete by: As directed    Will send Cardiac Rehab Phase 2 referral to [place]   Diagnosis: Valve Replacement   Valve: Aortic   After initial evaluation and assessments completed: Virtual Based Care may be provided alone or in conjunction with Phase 2 Cardiac Rehab based on patient barriers.: Yes     Allergies as of 06/08/2020      Reactions   Penicillin G Swelling   06/04/20: patient reports facial swelling ~30 years ago   Vancomycin Hives   Developed welts/hives during infusion Possible infusion reaction - consider extending infusion and premedicating with benadryl      Medication List    STOP taking these medications   ibuprofen 200 MG tablet Commonly known as: ADVIL     TAKE these medications   acetaminophen 500 MG tablet Commonly known as: TYLENOL Take 1-2 tablets (500-1,000 mg total) by mouth every 6 (six) hours as needed (pain).    albuterol 108 (90 Base) MCG/ACT inhaler Commonly known as: VENTOLIN HFA Inhale 2 puffs into the lungs every 6 (six) hours as needed for wheezing or shortness of breath.   aspirin 325 MG EC tablet Take 1 tablet (325 mg total) by mouth daily.   candesartan 8 MG tablet Commonly known as: ATACAND Take 8 mg by mouth in the morning.   cetirizine-pseudoephedrine 5-120 MG tablet Commonly known as: ZYRTEC-D Take 1 tablet by mouth 2 (two) times daily as needed for allergies.   dextromethorphan-guaiFENesin 30-600 MG 12hr tablet Commonly known as: MUCINEX DM Take 1 tablet by mouth 2 (two) times daily as needed (congestion).   EPINEPHrine 0.3 mg/0.3 mL Soaj injection Commonly known as: EPI-PEN Inject 0.3 mg into the muscle as needed for anaphylaxis.   lansoprazole 15 MG capsule Commonly known as: PREVACID Take 15 mg by mouth daily.   metoprolol tartrate 50 MG tablet Commonly known as: LOPRESSOR Take 1 tablet (50 mg total) by mouth daily. What changed: when to take this   multivitamin with minerals Tabs tablet Take 1 tablet by mouth in the morning. Centrum for Men   pravastatin 10 MG tablet Commonly known as: PRAVACHOL Take 5 mg by mouth in the morning.   psyllium 28 % packet Commonly known as: METAMUCIL SMOOTH TEXTURE Take 1 packet by mouth in the morning.   sildenafil 20 MG tablet Commonly known as: REVATIO Take 20 mg by mouth daily as needed (erectile dysfunction).   Testosterone 20.25 MG/ACT (1.62%) Gel Apply 2 Pump topically daily. APPLIED ON CHEST IN THE MORNING   traMADol 50 MG tablet  Commonly known as: ULTRAM Take 1-2 tablets (50-100 mg total) by mouth every 4 (four) hours as needed for moderate pain.   vitamin C 1000 MG tablet Take 1,000 mg by mouth in the morning.   Vitamin D3 125 MCG (5000 UT) Tabs Take 5,000 Units by mouth in the morning.       Follow-up Information    [facility] [place] Follow up on 06/13/2020.    Specialty: Cardiothoracic Surgery Why: Appointment is at 11:00 Contact information: [address] [place], [address] [place] [address] [phone]       [facility] [place] Follow up on 07/08/2020.   Specialty: Cardiothoracic Surgery Why: Appointment is at 1:00, please get CXR at 12:30 at [facility] imaging located on first floor of our office building  Contact information: [address] [place], [address] [place] [address] [phone]       Georgeanna Lea, MD Follow up on 06/26/2020.   Specialty: Cardiology Why: Appointment is at 10:00 Contact information: [address] [place] [address] [phone]               Signed: Lowella Dandy, PA-C 06/08/2020, 11:25 AM

## 2020-06-06 NOTE — Addendum Note (Signed)
Addendum  created 06/06/20 6270 by Adair Laundry, CRNA   Order list changed, Pharmacy for encounter modified

## 2020-06-06 NOTE — Progress Notes (Addendum)
      [address]       [name] [address]             [phone]      2 Days Post-Op Procedure(s) (LRB): TRANSESOPHAGEAL ECHOCARDIOGRAM (TEE) (N/A) AORTIC VALVE REPLACEMENT (AVR)  using Inspiris Valve size 56mm (N/A)   Subjective:  Sitting up in bed, awaiting transfer to 4E.Marland Kitchen No new complaints, pain control improved.  Denies N/V.   No BM yet  Objective: Vital signs in last 24 hours: Temp:  [98.2 F (36.8 C)-99.2 F (37.3 C)] 98.2 F (36.8 C) (05/05 1123) Pulse Rate:  [72-104] 83 (05/05 1200) Cardiac Rhythm: Normal sinus rhythm (05/05 1200) Resp:  [8-19] 13 (05/05 1200) BP: (103-136)/(73-107) 120/83 (05/05 1200) SpO2:  [94 %-99 %] 98 % (05/05 1200) Arterial Line BP: (94-146)/(67-90) 126/76 (05/05 0800) Weight:  [109 kg] 124 kg (05/05 0500)  Intake/Output from previous day: 05/04 0701 - 05/05 0700 In: 812.5 [P.O.:120; I.V.:147.7; IV Piggyback:544.8] Out: 2370 [Urine:2180; Chest Tube:190] Intake/Output this shift: Total I/O In: 629.9 [P.O.:480; IV Piggyback:149.9] Out: 850 [Urine:850]  General appearance: alert, cooperative and no distress Heart: regular rate and rhythm Lungs: clear to auscultation bilaterally Abdomen: soft, non-tender; bowel sounds normal; no masses,  no organomegaly Extremities: edema trace Wound: clean and dry  Lab Results: Recent Labs    06/05/20 1713 06/06/20 0436  WBC 15.2* 11.1*  HGB 15.0 13.7  HCT 43.5 40.0  PLT 144* 120*   BMET:  Recent Labs    06/05/20 1713 06/06/20 0436  NA 130* 130*  K 3.6 3.8  CL 98 97*  CO2 24 26  GLUCOSE 158* 109*  BUN 14 12  CREATININE 0.92 0.78  CALCIUM 8.2* 8.3*    PT/INR:  Recent Labs    06/04/20 1346  LABPROT 17.4*  INR 1.4*   ABG    Component Value Date/Time   PHART 7.352 06/04/2020 1824   HCO3 22.8 06/04/2020 1824   TCO2 24 06/04/2020 1824   ACIDBASEDEF 3.0 (H) 06/04/2020 1824   O2SAT 98.0 06/04/2020 1824   CBG (last 3)  Recent Labs    06/05/20 1948  06/05/20 2351 06/06/20 0440  GLUCAP 140* 129* 106*    Assessment/Plan: S/P Procedure(s) (LRB): TRANSESOPHAGEAL ECHOCARDIOGRAM (TEE) (N/A) AORTIC VALVE REPLACEMENT (AVR)  using Inspiris Valve size 16mm (N/A)  1. CV- NSR, BP stable- on Lopressor, Avapro 2. Pulm- CTs removed this morning, repeat CXR in AM.. this morning, no pneumothorax, significant effusion or atelectasis present 3. Renal- creatinine WNL, weight trending down, responding well to IV lasix, K is borderline at 3.8, IV runs completed 4. GI- no BM yet, continue stool softners 5. Dispo- patient stable, maintaining NSR, CTs removed this morning, awaiting bed on 4E   LOS: 2 days    Lowella Dandy, PA-C 06/06/2020   I have seen and examined the patient and agree with the assessment and plan as outlined.  Doing very well POD2  Purcell Nails, MD 06/06/2020

## 2020-06-07 ENCOUNTER — Inpatient Hospital Stay (HOSPITAL_COMMUNITY): Payer: BC Managed Care – PPO

## 2020-06-07 ENCOUNTER — Other Ambulatory Visit: Payer: Self-pay | Admitting: Cardiology

## 2020-06-07 DIAGNOSIS — Z952 Presence of prosthetic heart valve: Secondary | ICD-10-CM

## 2020-06-07 LAB — CBC
HCT: 39.3 % (ref 39.0–52.0)
Hemoglobin: 13.5 g/dL (ref 13.0–17.0)
MCH: 31.5 pg (ref 26.0–34.0)
MCHC: 34.4 g/dL (ref 30.0–36.0)
MCV: 91.6 fL (ref 80.0–100.0)
Platelets: 124 10*3/uL — ABNORMAL LOW (ref 150–400)
RBC: 4.29 MIL/uL (ref 4.22–5.81)
RDW: 13.3 % (ref 11.5–15.5)
WBC: 10.1 10*3/uL (ref 4.0–10.5)
nRBC: 0 % (ref 0.0–0.2)

## 2020-06-07 LAB — BASIC METABOLIC PANEL
Anion gap: 5 (ref 5–15)
BUN: 13 mg/dL (ref 6–20)
CO2: 28 mmol/L (ref 22–32)
Calcium: 8.4 mg/dL — ABNORMAL LOW (ref 8.9–10.3)
Chloride: 101 mmol/L (ref 98–111)
Creatinine, Ser: 1.03 mg/dL (ref 0.61–1.24)
GFR, Estimated: >60 mL/min (ref >60)
Glucose, Bld: 103 mg/dL — ABNORMAL HIGH (ref 70–99)
Potassium: 3.8 mmol/L (ref 3.5–5.1)
Sodium: 134 mmol/L — ABNORMAL LOW (ref 135–145)

## 2020-06-07 MED ORDER — POTASSIUM CHLORIDE CRYS ER 20 MEQ PO TBCR
40.0000 meq | EXTENDED_RELEASE_TABLET | Freq: Once | ORAL | Status: AC
  Administered 2020-06-07: 40 meq via ORAL
  Filled 2020-06-07: qty 2

## 2020-06-07 MED ORDER — LACTULOSE 10 GM/15ML PO SOLN: 20.0000 g | Freq: Every day | ORAL | Status: DC | PRN

## 2020-06-07 NOTE — Progress Notes (Signed)
CARDIAC REHAB PHASE I   PRE:  Rate/Rhythm: 99 SR  BP:  Sitting: 120/95      SaO2: 100 RA  MODE:  Ambulation: 670 ft   POST:  Rate/Rhythm: 106 ST  BP:  Sitting: 141/106    SaO2: 100 RA   Pt ambulated 666ft in hallway independently with steady gait. Pt denies CP, SOB, or dizziness. Able to maintain sternal precautions throughout session. Pt returned to bed. D/c education completed with pt and wife. Pt educated on importance of site care and monitoring incisions daily. Encouraged continued IS use, walks, and sternal precautions. Pt given in-the-tube sheet along with heart healthy diet. Reviewed site care, restrictions, and exercise guidelines. Will refer to CRP II [place].  [id number] Reynold Bowen, RN BSN 06/07/2020 11:02 AM

## 2020-06-07 NOTE — Progress Notes (Addendum)
      [address]       [name] [address]             [phone]      3 Days Post-Op Procedure(s) (LRB): TRANSESOPHAGEAL ECHOCARDIOGRAM (TEE) (N/A) AORTIC VALVE REPLACEMENT (AVR)  using Inspiris Valve size 108mm (N/A)   Subjective:  Patient states he had a rough night.  He states he was up and down going to the bathroom and it increased his pain level.  He isn't coughing because it hurts.  He was educated on the importance of deep breathing and use of IS  Objective: Vital signs in last 24 hours: Temp:  [97.9 F (36.6 C)-99.2 F (37.3 C)] 98 F (36.7 C) (05/06 0344) Pulse Rate:  [78-100] 78 (05/06 0344) Cardiac Rhythm: Normal sinus rhythm (05/05 1900) Resp:  [13-19] 14 (05/06 0344) BP: (106-129)/(81-96) 126/89 (05/06 0344) SpO2:  [96 %-99 %] 98 % (05/06 0344) Arterial Line BP: (126)/(76) 126/76 (05/05 0800) Weight:  [123.3 kg] 123.3 kg (05/06 0453)  Intake/Output from previous day: 05/05 0701 - 05/06 0700 In: 629.9 [P.O.:480; IV Piggyback:149.9] Out: 850 [Urine:850]  General appearance: alert, cooperative and no distress Heart: regular rate and rhythm Lungs: clear to auscultation bilaterally Abdomen: soft, non-tender; bowel sounds normal; no masses,  no organomegaly Extremities: edema trace Wound: clean and dry  Lab Results: Recent Labs    06/06/20 0436 06/07/20 0151  WBC 11.1* 10.1  HGB 13.7 13.5  HCT 40.0 39.3  PLT 120* 124*   BMET:  Recent Labs    06/06/20 0436 06/07/20 0151  NA 130* 134*  K 3.8 3.8  CL 97* 101  CO2 26 28  GLUCOSE 109* 103*  BUN 12 13  CREATININE 0.78 1.03  CALCIUM 8.3* 8.4*    PT/INR:  Recent Labs    06/04/20 1346  LABPROT 17.4*  INR 1.4*   ABG    Component Value Date/Time   PHART 7.352 06/04/2020 1824   HCO3 22.8 06/04/2020 1824   TCO2 24 06/04/2020 1824   ACIDBASEDEF 3.0 (H) 06/04/2020 1824   O2SAT 98.0 06/04/2020 1824   CBG (last 3)  Recent Labs    06/05/20 1948 06/05/20 2351 06/06/20 0440   GLUCAP 140* 129* 106*    Assessment/Plan: S/P Procedure(s) (LRB): TRANSESOPHAGEAL ECHOCARDIOGRAM (TEE) (N/A) AORTIC VALVE REPLACEMENT (AVR)  using Inspiris Valve size 6mm (N/A)  1. CV- NSR, BP stable-continue Lopressor, Avapro 2. Pulm- no acute issues, CXR w/o pneumothorax, patient educated on importance of IS use, deep breathing, coughing 3. Renal- creatinine WNL, weight is stable, Lasix ordered, potassium stable 4. GI- constipation, continue stool softners, add Lactulose prn 5. dispo- patient stable, remains in NSR, encouraged pulmonary toilet, will remove EPW in AM if no arrhyhtmia present, likely d/c this weekend   LOS: 3 days    Lowella Dandy, PA-C 06/07/2020   I have seen and examined the patient and agree with the assessment and plan as outlined.  Purcell Nails, MD 06/07/2020 7:48 AM

## 2020-06-07 NOTE — Progress Notes (Signed)
Mobility Specialist: Progress Note   06/07/20 1307  Mobility  Activity Ambulated in hall  Level of Assistance Independent  Assistive Device None  Distance Ambulated (ft) 880 ft  Mobility Response Tolerated well  Mobility performed by Mobility specialist  Bed Position Chair  $Mobility charge 1 Mobility   Pre-Mobility: 98 HR Post-Mobility: 105 HR, 120/106 BP, 100% SpO2  Pt asx during ambulation. Pt to chair after walk per request with call bell at his side. Pt requesting tylenol after walk, RN notified.   [facility] Ivianna Notch Mobility Specialist Mobility Specialist Phone: [phone]

## 2020-06-08 MED ORDER — ACETAMINOPHEN 500 MG PO TABS: 500.0000 mg | ORAL_TABLET | Freq: Four times a day (QID) | ORAL | 0 refills | Status: AC | PRN

## 2020-06-08 MED ORDER — TRAMADOL HCL 50 MG PO TABS: 50.0000 mg | ORAL_TABLET | ORAL | 0 refills | Status: DC | PRN

## 2020-06-08 MED ORDER — ASPIRIN 325 MG PO TBEC: 325.0000 mg | DELAYED_RELEASE_TABLET | Freq: Every day | ORAL | 0 refills | Status: DC

## 2020-06-08 NOTE — Progress Notes (Signed)
Mobility Specialist: Progress Note   06/08/20 1042  Mobility  Activity Ambulated in hall  Level of Assistance Independent  Assistive Device None  Distance Ambulated (ft) 750 ft  Mobility Response Tolerated well  Mobility performed by Mobility specialist  $Mobility charge 1 Mobility   Pre-Mobility: 100 HR During Mobility: 100% SpO2 Post-Mobility: 105 HR, 100% SpO2  Pt c/o coughing more this morning, otherwise asx. Pt expressed he has followed his sternal precautions by holding his pillow when he coughs. Pt to BR after walk with wife present in room.   [facility] Shaconda Hajduk Mobility Specialist Mobility Specialist Phone: [phone]

## 2020-06-08 NOTE — Progress Notes (Addendum)
      [address]       [place] [address]             [phone]      4 Days Post-Op Procedure(s) (LRB): TRANSESOPHAGEAL ECHOCARDIOGRAM (TEE) (N/A) AORTIC VALVE REPLACEMENT (AVR)  using Inspiris Valve size 51mm (N/A)   Subjective:  No new complaints.  Up moving around the room. Hopeful to go home today.  + ambulation   + BM  Objective: Vital signs in last 24 hours: Temp:  [97.7 F (36.5 C)-98.1 F (36.7 C)] 97.7 F (36.5 C) (05/07 0426) Pulse Rate:  [60-109] 64 (05/07 0426) Cardiac Rhythm: Normal sinus rhythm;Sinus tachycardia (05/06 2041) Resp:  [10-20] 15 (05/07 0426) BP: (127-154)/(83-109) 154/109 (05/07 0426) SpO2:  [97 %-100 %] 97 % (05/07 0426) Weight:  [124.8 kg] 124.8 kg (05/07 0426)  Intake/Output from previous day: 05/06 0701 - 05/07 0700 In: 120 [P.O.:120] Out: -   General appearance: alert, cooperative and no distress Heart: regular rate and rhythm Lungs: clear to auscultation bilaterally Abdomen: soft, non-tender; bowel sounds normal; no masses,  no organomegaly Extremities: edema minimal Wound: clean and dry  Lab Results: Recent Labs    06/06/20 0436 06/07/20 0151  WBC 11.1* 10.1  HGB 13.7 13.5  HCT 40.0 39.3  PLT 120* 124*   BMET:  Recent Labs    06/06/20 0436 06/07/20 0151  NA 130* 134*  K 3.8 3.8  CL 97* 101  CO2 26 28  GLUCOSE 109* 103*  BUN 12 13  CREATININE 0.78 1.03  CALCIUM 8.3* 8.4*    PT/INR: No results for input(s): LABPROT, INR in the last 72 hours. ABG    Component Value Date/Time   PHART 7.352 06/04/2020 1824   HCO3 22.8 06/04/2020 1824   TCO2 24 06/04/2020 1824   ACIDBASEDEF 3.0 (H) 06/04/2020 1824   O2SAT 98.0 06/04/2020 1824   CBG (last 3)  Recent Labs    06/05/20 1948 06/05/20 2351 06/06/20 0440  GLUCAP 140* 129* 106*    Assessment/Plan: S/P Procedure(s) (LRB): TRANSESOPHAGEAL ECHOCARDIOGRAM (TEE) (N/A) AORTIC VALVE REPLACEMENT (AVR)  using Inspiris Valve size 61mm (N/A)  1.  CV- Sinus Tach, BP slightly increased- will titrate BB to home regimen of 50 mg BID, EPW removed 2. Pulm- no acute issues, continue IS 3. Renal-creatinine remains stable, last dose of Lasix today, appears euvolemic this morning, will discuss home lasix with Dr. Cornelius Moras 4. GI- constipation resolved 5. Dispo- patient stable, titrate BB for additional HR and BP control, EPW out, discuss lasix with Dr. Cornelius Moras, will d/c home today if okay with Dr. Cornelius Moras   LOS: 4 days    Lowella Dandy, PA-C 06/08/2020   I have seen and examined the patient and agree with the assessment and plan as outlined.  D/C home today.  Instructions given  Purcell Nails, MD 06/08/2020 12:17 PM

## 2020-06-08 NOTE — Progress Notes (Signed)
Discharge instructions provided to patient. All medications, follow up appointments, and discharge instructions provided to patient. IV out. Monitor off CCMD notified. Discharging home with wife.  Sabra Heck, RN

## 2020-06-10 ENCOUNTER — Telehealth: Payer: Self-pay

## 2020-06-10 ENCOUNTER — Telehealth: Payer: Self-pay | Admitting: Cardiology

## 2020-06-10 MED ORDER — METOPROLOL TARTRATE 50 MG PO TABS: 50.0000 mg | ORAL_TABLET | Freq: Every day | ORAL | 0 refills | Status: DC

## 2020-06-10 MED FILL — Electrolyte-R (PH 7.4) Solution: INTRAVENOUS | Qty: 5000 | Status: AC

## 2020-06-10 MED FILL — Heparin Sodium (Porcine) Inj 1000 Unit/ML: INTRAMUSCULAR | Qty: 10 | Status: AC

## 2020-06-10 MED FILL — Mannitol IV Soln 20%: INTRAVENOUS | Qty: 500 | Status: AC

## 2020-06-10 MED FILL — Sodium Chloride IV Soln 0.9%: INTRAVENOUS | Qty: 2000 | Status: AC

## 2020-06-10 MED FILL — Sodium Bicarbonate IV Soln 8.4%: INTRAVENOUS | Qty: 50 | Status: AC

## 2020-06-10 NOTE — Telephone Encounter (Signed)
Patient contacted the office concerned about a bruise on his left breast that migrates to his sternum just at his incision.  He is s/p AVReplacement with Dr. Cornelius Moras 06/04/20 and was discharged from the hospital 06/08/20.  He states that it does not hurt and when asked does not see any hard lumps. Advised that this is normal after surgery and should dissipate on it's own. He acknowledged receipt.  Was unsure if he was doing too much around the house. Advised on sternal precautions and he acknowledged receipt.

## 2020-06-10 NOTE — Telephone Encounter (Signed)
Refill sent in per request.  

## 2020-06-10 NOTE — Telephone Encounter (Signed)
*  STAT* If patient is at the pharmacy, call can be transferred to refill team.   1. Which medications need to be refilled? (please list name of each medication and dose if known)  Need a new prescription to his local pharmacy until mail order comes for Metoprolol  2. Which pharmacy/location (including street and city if local pharmacy) is medication to be sent to? [facility], [place]  3. Do they need a 30 day or 90 day supply? 30

## 2020-06-13 ENCOUNTER — Other Ambulatory Visit: Payer: Self-pay

## 2020-06-13 ENCOUNTER — Encounter (INDEPENDENT_AMBULATORY_CARE_PROVIDER_SITE_OTHER): Payer: Self-pay

## 2020-06-13 DIAGNOSIS — Z4802 Encounter for removal of sutures: Secondary | ICD-10-CM

## 2020-06-14 ENCOUNTER — Telehealth (HOSPITAL_COMMUNITY): Payer: Self-pay

## 2020-06-14 NOTE — Telephone Encounter (Signed)
Per phase I cardiac rehab, fax pt cardiac rehab referral to [facility]. 

## 2020-06-20 ENCOUNTER — Encounter: Payer: Self-pay | Admitting: Thoracic Surgery (Cardiothoracic Vascular Surgery)

## 2020-06-25 DIAGNOSIS — G4730 Sleep apnea, unspecified: Secondary | ICD-10-CM | POA: Insufficient documentation

## 2020-06-25 DIAGNOSIS — K760 Fatty (change of) liver, not elsewhere classified: Secondary | ICD-10-CM | POA: Insufficient documentation

## 2020-06-25 DIAGNOSIS — J439 Emphysema, unspecified: Secondary | ICD-10-CM | POA: Insufficient documentation

## 2020-06-25 DIAGNOSIS — R011 Cardiac murmur, unspecified: Secondary | ICD-10-CM | POA: Insufficient documentation

## 2020-06-25 DIAGNOSIS — K219 Gastro-esophageal reflux disease without esophagitis: Secondary | ICD-10-CM | POA: Insufficient documentation

## 2020-06-26 ENCOUNTER — Encounter: Payer: Self-pay | Admitting: Cardiology

## 2020-06-26 ENCOUNTER — Other Ambulatory Visit: Payer: Self-pay

## 2020-06-26 ENCOUNTER — Ambulatory Visit (INDEPENDENT_AMBULATORY_CARE_PROVIDER_SITE_OTHER): Payer: BC Managed Care – PPO | Admitting: Cardiology

## 2020-06-26 VITALS — BP 128/76 | HR 96 | Ht 70.0 in | Wt 263.0 lb

## 2020-06-26 DIAGNOSIS — I10 Essential (primary) hypertension: Secondary | ICD-10-CM

## 2020-06-26 DIAGNOSIS — G4733 Obstructive sleep apnea (adult) (pediatric): Secondary | ICD-10-CM | POA: Diagnosis not present

## 2020-06-26 DIAGNOSIS — Z953 Presence of xenogenic heart valve: Secondary | ICD-10-CM | POA: Diagnosis not present

## 2020-06-26 DIAGNOSIS — E785 Hyperlipidemia, unspecified: Secondary | ICD-10-CM

## 2020-06-26 DIAGNOSIS — I5032 Chronic diastolic (congestive) heart failure: Secondary | ICD-10-CM | POA: Diagnosis not present

## 2020-06-26 NOTE — Patient Instructions (Signed)

## 2020-06-26 NOTE — Progress Notes (Signed)
Cardiology Office Note:    Date:  06/26/2020   ID:  Jackson Roberts, DOB 1965-10-02, MRN [id number]  PCP:  Nonnie Done., MD  Cardiologist:  Gypsy Balsam, MD    Referring MD: Nonnie Done., MD   Chief Complaint  Patient presents with  . Follow-up  Status post aortic valve replacement secondary to aortic insufficiency  History of Present Illness:    Jackson Roberts is a 55 y.o. male with past medical history significant for decreased deficiencies that that being watching for many years.  He start developing LV enlargement and decision has been made to pursue aortic valve replacement.  On 04 Jun 2020 he was taken to the operating room and underwent aortic valve replacement with 27 mm Edwards InspirEase Resilia bioprosthetic valve.  Recovery was uncomplicated except he did have some reaction to antibiotic.  He was discharged home after few days and actually doing very well.  He said he is feeling much better.  Shortness of breath much improved.  He still gets some pain in the chest which is after incision prior but otherwise doing well.  He walks on the regular basis and gradually feeling better.  No palpitations no swelling of lower extremities no dizziness.  Past Medical History:  Diagnosis Date  . Chronic diastolic congestive heart failure (HCC)   . 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 Dizziness 01/17/2020  . Dyslipidemia   . Emphysema of lung (HCC)   . Essential hypertension   . Fatty liver   . GERD (gastroesophageal reflux disease)   . Heart murmur    due to arotic insufficiency  . Incidental pulmonary nodule, > 69mm and < 4mm 05/01/2020   Right middle lobe - needs f/u CT  . Mixed hyperlipidemia   . Nonrheumatic aortic valve insufficiency   . Plantar fasciitis of right foot 07/17/2015  . S/P aortic valve replacement with bioprosthetic valve 06/04/2020   27 mm [facility] [employer] Inspiris Resilia Bioprosthetic Valve  SN: [id number]  . S/P AVR (aortic valve  replacement) 06/04/2020  . Sleep apnea    wears CPAP    Past Surgical History:  Procedure Laterality Date  . AORTIC VALVE REPLACEMENT N/A 06/04/2020   Procedure: AORTIC VALVE REPLACEMENT (AVR)  using Inspiris Valve size 21mm;  Surgeon: Purcell Nails, MD;  Location: [facility] OR;  Service: Open Heart Surgery;  Laterality: N/A;  . RIGHT/LEFT HEART CATH AND CORONARY ANGIOGRAPHY N/A 03/15/2020   Procedure: RIGHT/LEFT HEART CATH AND CORONARY ANGIOGRAPHY;  Surgeon: Tonny Bollman, MD;  Location: [facility] INVASIVE CV LAB;  Service: Cardiovascular;  Laterality: N/A;  . TEE WITHOUT CARDIOVERSION N/A 05/08/2020   Procedure: TRANSESOPHAGEAL ECHOCARDIOGRAM (TEE);  Surgeon: Thurmon Fair, MD;  Location: [facility] ENDOSCOPY;  Service: Cardiovascular;  Laterality: N/A;  . TEE WITHOUT CARDIOVERSION N/A 06/04/2020   Procedure: TRANSESOPHAGEAL ECHOCARDIOGRAM (TEE);  Surgeon: Purcell Nails, MD;  Location: [facility] OR;  Service: Open Heart Surgery;  Laterality: N/A;    Current Medications: Current Meds  Medication Sig  . acetaminophen (TYLENOL) 500 MG tablet Take 1-2 tablets (500-1,000 mg total) by mouth every 6 (six) hours as needed (pain). (Patient taking differently: Take 500-1,000 mg by mouth every 6 (six) hours as needed for moderate pain or mild pain (pain).)  . albuterol (VENTOLIN HFA) 108 (90 Base) MCG/ACT inhaler Inhale 2 puffs into the lungs every 6 (six) hours as needed for wheezing or shortness of breath.  . Ascorbic Acid (VITAMIN C) 1000 MG tablet Take 1,000 mg by mouth in the morning.  Marland Kitchen  aspirin EC 325 MG EC tablet Take 1 tablet (325 mg total) by mouth daily.  . candesartan (ATACAND) 8 MG tablet Take 8 mg by mouth in the morning.  . cetirizine-pseudoephedrine (ZYRTEC-D) 5-120 MG tablet Take 1 tablet by mouth 2 (two) times daily as needed for allergies.  . Cholecalciferol (VITAMIN D3) 5000 units TABS Take 5,000 Units by mouth in the morning.  Marland Kitchen dextromethorphan-guaiFENesin (MUCINEX DM) 30-600 MG 12hr tablet Take 1 tablet  by mouth 2 (two) times daily as needed for cough (congestion).  Marland Kitchen EPINEPHrine 0.3 mg/0.3 mL IJ SOAJ injection Inject 0.3 mg into the muscle as needed for anaphylaxis.  Marland Kitchen lansoprazole (PREVACID) 15 MG capsule Take 15 mg by mouth daily.  . metoprolol tartrate (LOPRESSOR) 50 MG tablet Take 1 tablet (50 mg total) by mouth daily.  . Multiple Vitamin (MULTIVITAMIN WITH MINERALS) TABS tablet Take 1 tablet by mouth in the morning. Centrum for Men/Unknown sterenght  . pravastatin (PRAVACHOL) 10 MG tablet Take 5 mg by mouth in the morning.  . psyllium (METAMUCIL SMOOTH TEXTURE) 28 % packet Take 1 packet by mouth in the morning.  . sildenafil (REVATIO) 20 MG tablet Take 20 mg by mouth daily as needed (erectile dysfunction).  . traMADol (ULTRAM) 50 MG tablet Take 1-2 tablets (50-100 mg total) by mouth every 4 (four) hours as needed for moderate pain.     Allergies:   Penicillin g and Vancomycin   Social History   Socioeconomic History  . Marital status: Married    Spouse name: Not on file  . Number of children: Not on file  . Years of education: Not on file  . Highest education level: Not on file  Occupational History  . Not on file  Tobacco Use  . Smoking status: Former Smoker    Packs/day: 1.00    Years: 15.00    Pack years: 15.00    Types: Cigarettes    Quit date: 09/11/2007    Years since quitting: 12.8  . Smokeless tobacco: Never Used  Vaping Use  . Vaping Use: Never used  Substance and Sexual Activity  . Alcohol use: Yes    Alcohol/week: 1.0 standard drink    Types: 1 Cans of beer per week    Comment: socially  . Drug use: Never  . Sexual activity: Not on file  Other Topics Concern  . Not on file  Social History Narrative  . Not on file   Social Determinants of Health   Financial Resource Strain: Not on file  Food Insecurity: Not on file  Transportation Needs: Not on file  Physical Activity: Not on file  Stress: Not on file  Social Connections: Not on file     Family  History: The patient's family history includes Bone cancer in his maternal grandfather; Hypertension in his father and mother; Mitral valve prolapse in his mother; Stroke in his maternal grandfather. ROS:   Please see the history of present illness.    All 14 point review of systems negative except as described per history of present illness  EKGs/Labs/Other Studies Reviewed:      Recent Labs: 05/31/2020: ALT 44 06/05/2020: Magnesium 2.2 06/07/2020: BUN 13; Creatinine, Ser 1.03; Hemoglobin 13.5; Platelets 124; Potassium 3.8; Sodium 134  Recent Lipid Panel No results found for: CHOL, TRIG, HDL, CHOLHDL, VLDL, LDLCALC, LDLDIRECT  Physical Exam:    VS:  BP 128/76 (BP Location: Right Arm, Patient Position: Sitting)   Pulse 96   Ht 5\' 10"  (1.778 m)   Wt  263 lb (119.3 kg)   SpO2 98%   BMI 37.74 kg/m     Wt Readings from Last 3 Encounters:  06/26/20 263 lb (119.3 kg)  06/08/20 275 lb 2.9 oz (124.8 kg)  05/31/20 271 lb 8 oz (123.2 kg)     GEN:  Well nourished, well developed in no acute distress HEENT: Normal NECK: No JVD; No carotid bruits LYMPHATICS: No lymphadenopathy CARDIAC: RRR, no murmurs, no rubs, no gallops RESPIRATORY:  Clear to auscultation without rales, wheezing or rhonchi  ABDOMEN: Soft, non-tender, non-distended MUSCULOSKELETAL:  No edema; No deformity  SKIN: Warm and dry LOWER EXTREMITIES: no swelling NEUROLOGIC:  Alert and oriented x 3 PSYCHIATRIC:  Normal affect   ASSESSMENT:    1. S/P aortic valve replacement with bioprosthetic valve   2. Essential hypertension   3. Chronic diastolic congestive heart failure (HCC)   4. Obstructive sleep apnea syndrome   5. Dyslipidemia    PLAN:    In order of problems listed above:  1. Status post aortic valve replacement with bioprosthetic 27 mm.  Doing very well.  Wounds healed completely although clinically he is doing very well. 2. Essential hypertension, blood pressure seems to be well controlled we will continue  present management. 3. Dyslipidemia: In the future we will recheck his fasting lipid profile.  He did not have obstructive disease in the coronary arteries but need to have aggressive risk factors modifications. 4. Obstructive sleep apnea he is planning to have sleep study done to revisit that issue wishing to be addressed as well  Overall he is doing quite well after open heart surgery and aortic valve replacement.  He is very satisfied and happy with the care he got at [facility]. I did review record for this visit   Medication Adjustments/Labs and Tests Ordered: Current medicines are reviewed at length with the patient today.  Concerns regarding medicines are outlined above.  No orders of the defined types were placed in this encounter.  Medication changes: No orders of the defined types were placed in this encounter.   Signed, Georgeanna Lea, MD, [facility] 06/26/2020 10:57 AM    [facility]

## 2020-07-05 ENCOUNTER — Other Ambulatory Visit: Payer: Self-pay | Admitting: Thoracic Surgery (Cardiothoracic Vascular Surgery)

## 2020-07-05 DIAGNOSIS — Z952 Presence of prosthetic heart valve: Secondary | ICD-10-CM

## 2020-07-08 ENCOUNTER — Ambulatory Visit (INDEPENDENT_AMBULATORY_CARE_PROVIDER_SITE_OTHER): Payer: Self-pay | Admitting: Surgical

## 2020-07-08 ENCOUNTER — Ambulatory Visit
Admission: RE | Admit: 2020-07-08 | Discharge: 2020-07-08 | Disposition: A | Discharge status: Home / Self Care | Payer: BC Managed Care – PPO | Source: Ambulatory Visit | Attending: Thoracic Surgery (Cardiothoracic Vascular Surgery) | Admitting: Thoracic Surgery (Cardiothoracic Vascular Surgery)

## 2020-07-08 ENCOUNTER — Other Ambulatory Visit: Payer: Self-pay

## 2020-07-08 VITALS — BP 136/85 | HR 75 | Resp 20 | Ht 70.0 in | Wt 264.0 lb

## 2020-07-08 DIAGNOSIS — Z952 Presence of prosthetic heart valve: Secondary | ICD-10-CM

## 2020-07-08 MED ORDER — ASPIRIN 325 MG PO TBEC: 325.0000 mg | DELAYED_RELEASE_TABLET | Freq: Every day | ORAL | 2 refills | Status: AC

## 2020-07-08 NOTE — Progress Notes (Signed)
[address]       [name] [address]             [phone]    CARDIOTHORACIC SURGERY OPERATIVE NOTE  Date of Procedure:                06/04/2020  Preoperative Diagnosis:      Severe Aortic Insufficiency  Postoperative Diagnosis:    Same   Procedure:        Aortic Valve Replacement             Edwards Inspiris Resilia stented bovine pericardial tissue valve (size 27 mm, ref # [id number], serial # [id number])              Surgeon:        Salvatore Decent. Cornelius Moras, MD  Assistant:       Lowella Dandy, PA-C  Anesthesia:    Gaynelle Adu, MD  Operative Findings: ? Trileaflet aortic valve with severe degenerative disease causing leaflet prolapse ? Type II leaflet dysfunction causing severe aortic insufficiency ? Normal left ventricular systolic function    HPI:  Patient returns for routine postoperative follow-up having undergone the above described procedure.The patient's early postoperative recovery while in the hospital was notable for a routine postoperative recovery.Since hospital discharge the patient reports he is doing very well.  He has no specific complaints.  He denies fevers, chills or other significant constitutional symptoms.  He is having no significant shortness of breath or chest pain in terms of pain he is relying solely on Tylenol at this point for analgesia.  He has had no significant difficulty with his incisions.  He is not having any lower extremity edema.  He denies palpitations.  Overall he and his wife are remarkably pleased with his recovery.   Current Outpatient Medications  Medication Sig Dispense Refill  . acetaminophen (TYLENOL) 500 MG tablet Take 1-2 tablets (500-1,000 mg total) by mouth every 6 (six) hours as needed (pain). (Patient taking differently: Take 500-1,000 mg by mouth every 6 (six) hours as needed for moderate pain or mild pain (pain).) 30 tablet 0  . albuterol (VENTOLIN HFA) 108 (90 Base) MCG/ACT inhaler Inhale 2  puffs into the lungs every 6 (six) hours as needed for wheezing or shortness of breath.    . Ascorbic Acid (VITAMIN C) 1000 MG tablet Take 1,000 mg by mouth in the morning.    Marland Kitchen aspirin 325 MG EC tablet Take 1 tablet (325 mg total) by mouth daily. 90 tablet 2  . candesartan (ATACAND) 8 MG tablet Take 8 mg by mouth in the morning.    . cetirizine-pseudoephedrine (ZYRTEC-D) 5-120 MG tablet Take 1 tablet by mouth 2 (two) times daily as needed for allergies.    . Cholecalciferol (VITAMIN D3) 5000 units TABS Take 5,000 Units by mouth in the morning.    Marland Kitchen dextromethorphan-guaiFENesin (MUCINEX DM) 30-600 MG 12hr tablet Take 1 tablet by mouth 2 (two) times daily as needed for cough (congestion).    Marland Kitchen EPINEPHrine 0.3 mg/0.3 mL IJ SOAJ injection Inject 0.3 mg into the muscle as needed for anaphylaxis.    Marland Kitchen lansoprazole (PREVACID) 15 MG capsule Take 15 mg by mouth daily.    . metoprolol tartrate (LOPRESSOR) 50 MG tablet Take 1 tablet (50 mg total) by mouth daily. 30 tablet 0  . Multiple Vitamin (MULTIVITAMIN WITH MINERALS) TABS tablet Take 1 tablet by mouth in the morning. Centrum for Men/Unknown sterenght    . pravastatin (PRAVACHOL)  10 MG tablet Take 5 mg by mouth in the morning.    . psyllium (METAMUCIL SMOOTH TEXTURE) 28 % packet Take 1 packet by mouth in the morning.    . sildenafil (REVATIO) 20 MG tablet Take 20 mg by mouth daily as needed (erectile dysfunction).  5  . traMADol (ULTRAM) 50 MG tablet Take 1-2 tablets (50-100 mg total) by mouth every 4 (four) hours as needed for moderate pain. 30 tablet 0   No current facility-administered medications for this visit.    Physical Exam Constitutional:      General: He is not in acute distress.    Appearance: Normal appearance.  Cardiovascular:     Rate and Rhythm: Normal rate and regular rhythm.     Heart sounds: Normal heart sounds. No murmur heard. No friction rub. No gallop.   Pulmonary:     Effort: Pulmonary effort is normal.     Breath  sounds: Normal breath sounds.  Abdominal:     Comments: benign  Musculoskeletal:        General: No swelling.  Skin:    Comments: Incisions healing well without evidence of infection  Neurological:     Mental Status: He is alert.     Diagnostic Tests:CLINICAL DATA:  Patient status post aortic valve replacement 06/04/2020.  EXAM: CHEST - 2 VIEW  COMPARISON:  PA and lateral chest 06/07/2020.  FINDINGS: Aortic valve is unchanged in position. Heart size is normal. Lungs are clear. No pneumothorax or pleural fluid. No acute or focal bony abnormality.  IMPRESSION: No acute disease.   Electronically Signed   By: Drusilla Kanner M.D.   On: 07/08/2020 12:33   Impression: 55 year old male status post the above described procedure doing very well post surgical recovery with no specific surgical issues or worrisome symptoms.  Plan: We will see again on a as needed basis for any surgically related issues or at request.   Rowe Clack, PA-C [facility] [phone]

## 2020-07-08 NOTE — Patient Instructions (Signed)
Routine instructions regarding to activity progression and driving

## 2020-07-08 NOTE — Telephone Encounter (Signed)
Refill of Aspirin 325 mg sent to [facility].

## 2020-07-24 ENCOUNTER — Ambulatory Visit (INDEPENDENT_AMBULATORY_CARE_PROVIDER_SITE_OTHER): Payer: BC Managed Care – PPO

## 2020-07-24 ENCOUNTER — Other Ambulatory Visit: Payer: Self-pay

## 2020-07-24 DIAGNOSIS — Z952 Presence of prosthetic heart valve: Secondary | ICD-10-CM | POA: Diagnosis not present

## 2020-07-24 LAB — ECHOCARDIOGRAM COMPLETE
AR max vel: 2.03 cm2
AV Area VTI: 1.96 cm2
AV Area mean vel: 1.87 cm2
AV Mean grad: 9 mmHg
AV Peak grad: 16.3 mmHg
Ao pk vel: 2.02 m/s
Area-P 1/2: 7.02 cm2
S' Lateral: 3.5 cm

## 2020-07-24 NOTE — Progress Notes (Signed)
Complete echocardiogram performed.  Jimmy Glendene Wyer RDCS, RVT  

## 2020-07-24 NOTE — Progress Notes (Deleted)
Complete echocardiogram performed.  Jimmy Tulani Kidney RDCS, RVT  

## 2020-08-22 ENCOUNTER — Other Ambulatory Visit: Payer: Self-pay

## 2020-08-22 ENCOUNTER — Encounter: Payer: Self-pay | Admitting: Cardiology

## 2020-08-22 ENCOUNTER — Ambulatory Visit (INDEPENDENT_AMBULATORY_CARE_PROVIDER_SITE_OTHER): Payer: BC Managed Care – PPO | Admitting: Cardiology

## 2020-08-22 VITALS — BP 132/94 | HR 67 | Ht 70.0 in | Wt 266.0 lb

## 2020-08-22 DIAGNOSIS — I10 Essential (primary) hypertension: Secondary | ICD-10-CM

## 2020-08-22 DIAGNOSIS — E785 Hyperlipidemia, unspecified: Secondary | ICD-10-CM

## 2020-08-22 DIAGNOSIS — Z953 Presence of xenogenic heart valve: Secondary | ICD-10-CM | POA: Diagnosis not present

## 2020-08-22 NOTE — Patient Instructions (Signed)

## 2020-08-22 NOTE — Progress Notes (Signed)
Cardiology Office Note:    Date:  08/22/2020   ID:  Jackson Roberts, DOB 1965-11-09, MRN [id number]  PCP:  Nonnie Done., MD  Cardiologist:  Gypsy Balsam, MD    Referring MD: Nonnie Done., MD   Chief Complaint  Patient presents with   Medication Management   Results    Echo    History of Present Illness:    Jackson Roberts is a 55 y.o. male   with past medical history significant for decreased deficiencies that that being watching for many years.  He start developing LV enlargement and decision has been made to pursue aortic valve replacement.  On 04 Jun 2020 he was taken to the operating room and underwent aortic valve replacement with 27 mm Edwards InspirEase Resilia bioprosthetic valve.  Recovery was uncomplicated except he did have some reaction to antibiotic.  He was discharged home after few days and actually doing very well.   He comes today to my office for follow-up.  Overall he seems to be doing very well.  He denies have any chest pain tightness squeezing pressure burning chest.  He participate in rehab on the regular basis have no difficulty doing it.  Overall very happy and satisfied with results that he got.  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   Dizziness 01/17/2020   Dyslipidemia    Emphysema of lung (HCC)    Essential hypertension    Fatty liver    GERD (gastroesophageal reflux disease)    Heart murmur    due to arotic insufficiency   Incidental pulmonary nodule, > 69mm and < 72mm 05/01/2020   Right middle lobe - needs f/u CT   Mixed hyperlipidemia    Nonrheumatic aortic valve insufficiency    Plantar fasciitis of right foot 07/17/2015   S/P aortic valve replacement with bioprosthetic valve 06/04/2020   27 mm [employer] Inspiris Resilia Bioprosthetic Valve  SN: [id number]   S/P AVR (aortic valve replacement) 06/04/2020   Sleep apnea    wears CPAP     Past Surgical History:  Procedure Laterality Date   AORTIC VALVE REPLACEMENT N/A 06/04/2020   Procedure: AORTIC VALVE REPLACEMENT (AVR)  using Inspiris Valve size 38mm;  Surgeon: Purcell Nails, MD;  Location: [facility] OR;  Service: Open Heart Surgery;  Laterality: N/A;   RIGHT/LEFT HEART CATH AND CORONARY ANGIOGRAPHY N/A 03/15/2020   Procedure: RIGHT/LEFT HEART CATH AND CORONARY ANGIOGRAPHY;  Surgeon: Tonny Bollman, MD;  Location: [facility] INVASIVE CV LAB;  Service: Cardiovascular;  Laterality: N/A;   TEE WITHOUT CARDIOVERSION N/A 05/08/2020   Procedure: TRANSESOPHAGEAL ECHOCARDIOGRAM (TEE);  Surgeon: Thurmon Fair, MD;  Location: [facility] ENDOSCOPY;  Service: Cardiovascular;  Laterality: N/A;   TEE WITHOUT CARDIOVERSION N/A 06/04/2020   Procedure: TRANSESOPHAGEAL ECHOCARDIOGRAM (TEE);  Surgeon: Purcell Nails, MD;  Location: [facility] OR;  Service: Open Heart Surgery;  Laterality: N/A;    Current Medications: Current Meds  Medication Sig   acetaminophen (TYLENOL) 500 MG tablet Take 1-2 tablets (500-1,000 mg total) by mouth every 6 (six) hours as needed (pain). (Patient taking differently: Take 500-1,000 mg by mouth every 6 (six) hours as needed for moderate pain or mild pain (pain).)   albuterol (VENTOLIN HFA) 108 (90 Base) MCG/ACT inhaler Inhale 2 puffs into the lungs every 6 (six) hours as needed for wheezing or shortness of breath.   Ascorbic Acid (VITAMIN C) 1000 MG tablet Take 1,000 mg  by mouth in the morning.   aspirin 325 MG EC tablet Take 1 tablet (325 mg total) by mouth daily.   candesartan (ATACAND) 8 MG tablet Take 8 mg by mouth in the morning.   cetirizine-pseudoephedrine (ZYRTEC-D) 5-120 MG tablet Take 1 tablet by mouth 2 (two) times daily as needed for allergies.   Cholecalciferol (VITAMIN D3) 5000 units TABS Take 5,000 Units by mouth in the morning.   dextromethorphan-guaiFENesin (MUCINEX DM) 30-600 MG 12hr tablet Take 1 tablet by mouth 2 (two) times daily as needed for cough (congestion).    EPINEPHrine 0.3 mg/0.3 mL IJ SOAJ injection Inject 0.3 mg into the muscle as needed for anaphylaxis.   lansoprazole (PREVACID) 15 MG capsule Take 15 mg by mouth daily.   metoprolol tartrate (LOPRESSOR) 50 MG tablet Take 1 tablet (50 mg total) by mouth daily.   Multiple Vitamin (MULTIVITAMIN WITH MINERALS) TABS tablet Take 1 tablet by mouth in the morning. Centrum for Men/Unknown sterenght   pravastatin (PRAVACHOL) 10 MG tablet Take 5 mg by mouth in the morning.   psyllium (METAMUCIL SMOOTH TEXTURE) 28 % packet Take 1 packet by mouth in the morning. Unknown strenght   sildenafil (REVATIO) 20 MG tablet Take 20 mg by mouth daily as needed (erectile dysfunction).   traMADol (ULTRAM) 50 MG tablet Take 1-2 tablets (50-100 mg total) by mouth every 4 (four) hours as needed for moderate pain.     Allergies:   Penicillin g and Vancomycin   Social History   Socioeconomic History   Marital status: Married    Spouse name: Not on file   Number of children: Not on file   Years of education: Not on file   Highest education level: Not on file  Occupational History   Not on file  Tobacco Use   Smoking status: Former    Packs/day: 1.00    Years: 15.00    Pack years: 15.00    Types: Cigarettes    Quit date: 09/11/2007    Years since quitting: 12.9   Smokeless tobacco: Never  Vaping Use   Vaping Use: Never used  Substance and Sexual Activity   Alcohol use: Yes    Alcohol/week: 1.0 standard drink    Types: 1 Cans of beer per week    Comment: socially   Drug use: Never   Sexual activity: Not on file  Other Topics Concern   Not on file  Social History Narrative   Not on file   Social Determinants of Health   Financial Resource Strain: Not on file  Food Insecurity: Not on file  Transportation Needs: Not on file  Physical Activity: Not on file  Stress: Not on file  Social Connections: Not on file     Family History: The patient's family history includes Bone cancer in his maternal  grandfather; Hypertension in his father and mother; Mitral valve prolapse in his mother; Stroke in his maternal grandfather. ROS:   Please see the history of present illness.    All 14 point review of systems negative except as described per history of present illness  EKGs/Labs/Other Studies Reviewed:      Recent Labs: 05/31/2020: ALT 44 06/05/2020: Magnesium 2.2 06/07/2020: BUN 13; Creatinine, Ser 1.03; Hemoglobin 13.5; Platelets 124; Potassium 3.8; Sodium 134  Recent Lipid Panel No results found for: CHOL, TRIG, HDL, CHOLHDL, VLDL, LDLCALC, LDLDIRECT  Physical Exam:    VS:  BP (!) 132/94 (BP Location: Right Arm, Patient Position: Sitting)   Pulse 67   Ht  5\' 10"  (1.778 m)   Wt 266 lb (120.7 kg)   SpO2 92%   BMI 38.17 kg/m     Wt Readings from Last 3 Encounters:  08/22/20 266 lb (120.7 kg)  07/08/20 264 lb (119.7 kg)  06/26/20 263 lb (119.3 kg)     GEN:  Well nourished, well developed in no acute distress HEENT: Normal NECK: No JVD; No carotid bruits LYMPHATICS: No lymphadenopathy CARDIAC: RRR, no murmurs, no rubs, no gallops RESPIRATORY:  Clear to auscultation without rales, wheezing or rhonchi  ABDOMEN: Soft, non-tender, non-distended MUSCULOSKELETAL:  No edema; No deformity  SKIN: Warm and dry LOWER EXTREMITIES: no swelling NEUROLOGIC:  Alert and oriented x 3 PSYCHIATRIC:  Normal affect   ASSESSMENT:    1. S/P aortic valve replacement with bioprosthetic valve   2. Dyslipidemia   3. Essential hypertension    PLAN:    In order of problems listed above:  Status post aortic valve replacement secondary to severe arctic insufficiency with a gradual increase in size of the left ventricle which prompted 06/28/20 to do surgery.  Repeated echocardiogram reviewed with the patient.  Left ventricle end-diastolic diam is 50 mm.  Which is normal.  He does not have any aortic insufficiency, his left ventricle ejection fraction still normal.  Overall I think we achieved very good  results.  He is taking 325 mg aspirin and ask him to continue until 3 months after that lowered to 81.  He is asking me about dental cleaning I asked him to do light dental cleaning up to a year after valve replacement and I stressed importance of taking antibiotic before any dental work.  He understand and he will follow-up. Dyslipidemia he is taking cholesterol medication which I will continue I see him I will do his fasting lipid profile. Essential hypertension blood pressure controlled we will continue present management.   Medication Adjustments/Labs and Tests Ordered: Current medicines are reviewed at length with the patient today.  Concerns regarding medicines are outlined above.  No orders of the defined types were placed in this encounter.  Medication changes: No orders of the defined types were placed in this encounter.   Signed, [place], MD, [facility] 08/22/2020 12:23 PM    [facility]

## 2021-02-06 ENCOUNTER — Ambulatory Visit (INDEPENDENT_AMBULATORY_CARE_PROVIDER_SITE_OTHER): Payer: BC Managed Care – PPO | Admitting: Cardiology

## 2021-02-06 ENCOUNTER — Encounter: Payer: Self-pay | Admitting: Cardiology

## 2021-02-06 ENCOUNTER — Other Ambulatory Visit: Payer: Self-pay

## 2021-02-06 ENCOUNTER — Ambulatory Visit (INDEPENDENT_AMBULATORY_CARE_PROVIDER_SITE_OTHER): Payer: BC Managed Care – PPO

## 2021-02-06 VITALS — BP 128/78 | HR 75 | Ht 70.0 in | Wt 275.0 lb

## 2021-02-06 DIAGNOSIS — I10 Essential (primary) hypertension: Secondary | ICD-10-CM

## 2021-02-06 DIAGNOSIS — I5032 Chronic diastolic (congestive) heart failure: Secondary | ICD-10-CM

## 2021-02-06 DIAGNOSIS — Z953 Presence of xenogenic heart valve: Secondary | ICD-10-CM

## 2021-02-06 DIAGNOSIS — G4733 Obstructive sleep apnea (adult) (pediatric): Secondary | ICD-10-CM

## 2021-02-06 DIAGNOSIS — E785 Hyperlipidemia, unspecified: Secondary | ICD-10-CM

## 2021-02-06 NOTE — Progress Notes (Signed)
Cardiology Office Note:    Date:  02/06/2021   ID:  Jackson Roberts, DOB 08/06/1965, MRN [id number]  PCP:  Nonnie DoneSlatosky, John J., MD  Cardiologist:  Gypsy Balsamobert Ariauna Farabee, MD    Referring MD: Nonnie DoneSlatosky, John J., MD   No chief complaint on file. Doing fine  History of Present Illness:    Jackson Roberts is a 56 y.o. male with past medical history significant for aortic insufficiency that I being watching for many years however eventually he ended up developing enlargement of the left ventricle and decision was made to pursue surgical intervention.  On Jun 04, 2020 he was taken to the operating room and he did have aortic valve replacement with 27 mm Edwards InspirEase Resilia bioprosthetic valve.  Recovery was uncomplicated except for some reaction to antibiotic but overall seems to be doing quite well. He comes today to my office for regular follow-up.  Most of the time he is doing well concern however is dizziness he said that happen from time to time when he gets up very quickly either sitting on the floor playing with his grandchildren adjusting to getting off the chart we will give him dizziness that last for few seconds to few minutes and then goes away however yesterday he got much more profound event what he meant by that he did not passed out he did not fall down however he have to hold to something to prevent himself from falling down and that sensation lasted for up to 3 to 4 minutes.  It got him alarm and after he decided to come to my office today to talk about it.  He also said he felt little bit nauseated when this episode happened.  He did not completely passed out he did not have any chest pain he did not have any shortness of breath there is no palpitations otherwise he seems to be doing well trying to exercise on elliptical for half an hour 3 times a week.  He said when he does that he is completely wiped out but still trying to do that.  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   Dizziness 01/17/2020   Dyslipidemia    Emphysema of lung (HCC)    Essential hypertension    Fatty liver    GERD (gastroesophageal reflux disease)    Heart murmur    due to arotic insufficiency   Incidental pulmonary nodule, > 3mm and < 8mm 05/01/2020   Right middle lobe - needs f/u CT   Mixed hyperlipidemia    Nonrheumatic aortic valve insufficiency    Plantar fasciitis of right foot 07/17/2015   S/P aortic valve replacement with bioprosthetic valve 06/04/2020   27 mm [employer] Inspiris Resilia Bioprosthetic Valve  SN: [id number]   S/P AVR (aortic valve replacement) 06/04/2020   Sleep apnea    wears CPAP    Past Surgical History:  Procedure Laterality Date   AORTIC VALVE REPLACEMENT N/A 06/04/2020   Procedure: AORTIC VALVE REPLACEMENT (AVR)  using Inspiris Valve size 27mm;  Surgeon: Purcell Nailswen, Clarence H, MD;  [facility];  Service: Open Heart Surgery;  Laterality: N/A;   RIGHT/LEFT HEART CATH AND CORONARY ANGIOGRAPHY N/A 03/15/2020   Procedure: RIGHT/LEFT HEART CATH AND CORONARY ANGIOGRAPHY;  Surgeon: Tonny Bollmanooper, Michael, MD;  [facility];  Service: Cardiovascular;  Laterality: N/A;   TEE WITHOUT CARDIOVERSION N/A 05/08/2020   Procedure: TRANSESOPHAGEAL ECHOCARDIOGRAM (TEE);  Surgeon: Thurmon Fair, MD;  Location: [facility] ENDOSCOPY;  Service: Cardiovascular;  Laterality: N/A;   TEE WITHOUT CARDIOVERSION N/A 06/04/2020   Procedure: TRANSESOPHAGEAL ECHOCARDIOGRAM (TEE);  Surgeon: Purcell Nails, MD;  Location[facility] OR;  Service: Open Heart Surgery;  Laterality: N/A;    Current Medications: No outpatient medications have been marked as taking for the 02/06/21 encounter (Appointment) with Georgeanna Lea, MD.     Allergies:   Penicillin g and Vancomycin   Social History   Socioeconomic History   Marital status: Married    Spouse name: Not on file   Number of children: Not on file    Years of education: Not on file   Highest education level: Not on file  Occupational History   Not on file  Tobacco Use   Smoking status: Former    Packs/day: 1.00    Years: 15.00    Pack years: 15.00    Types: Cigarettes    Quit date: 09/11/2007    Years since quitting: 13.4   Smokeless tobacco: Never  Vaping Use   Vaping Use: Never used  Substance and Sexual Activity   Alcohol use: Yes    Alcohol/week: 1.0 standard drink    Types: 1 Cans of beer per week    Comment: socially   Drug use: Never   Sexual activity: Not on file  Other Topics Concern   Not on file  Social History Narrative   Not on file   Social Determinants of Health   Financial Resource Strain: Not on file  Food Insecurity: Not on file  Transportation Needs: Not on file  Physical Activity: Not on file  Stress: Not on file  Social Connections: Not on file     Family History: The patient's family history includes Bone cancer in his maternal grandfather; Hypertension in his father and mother; Mitral valve prolapse in his mother; Stroke in his maternal grandfather. ROS:   Please see the history of present illness.    All 14 point review of systems negative except as described per history of present illness  EKGs/Labs/Other Studies Reviewed:      Recent Labs: 05/31/2020: ALT 44 06/05/2020: Magnesium 2.2 06/07/2020: BUN 13; Creatinine, Ser 1.03; Hemoglobin 13.5; Platelets 124; Potassium 3.8; Sodium 134  Recent Lipid Panel No results found for: CHOL, TRIG, HDL, CHOLHDL, VLDL, LDLCALC, LDLDIRECT  Physical Exam:    VS:  There were no vitals taken for this visit.    Wt Readings from Last 3 Encounters:  08/22/20 266 lb (120.7 kg)  07/08/20 264 lb (119.7 kg)  06/26/20 263 lb (119.3 kg)     GEN:  Well nourished, well developed in no acute distress HEENT: Normal NECK: No JVD; No carotid bruits LYMPHATICS: No lymphadenopathy CARDIAC: RRR, no murmurs, no rubs, no gallops RESPIRATORY:  Clear to  auscultation without rales, wheezing or rhonchi  ABDOMEN: Soft, non-tender, non-distended MUSCULOSKELETAL:  No edema; No deformity  SKIN: Warm and dry LOWER EXTREMITIES: no swelling NEUROLOGIC:  Alert and oriented x 3 PSYCHIATRIC:  Normal affect   ASSESSMENT:    1. S/P aortic valve replacement with bioprosthetic valve   2. Dyslipidemia   3. Chronic diastolic congestive heart failure (HCC)   4. Essential hypertension   5. Obstructive sleep apnea syndrome    PLAN:    In order of problems listed above:  Status post arctic valve replacement with bioprosthetic valve.  Valve sounds beautifully.  Because of his symptomatology I will ask him to have repeated echocardiogram done to  make sure her heart is functioning properly and valve is functioning properly however on the physical examination I do not see anything alarming. Chronic diastolic congestive heart failure compensated on the physical exam.  We will continue present management. Dyslipidemia he is on pravastatin only 5 mg daily he had difficulty tolerating statin before.  I do not have his latest fasting lipid profile, will make arrangements to have his cholesterol rechecked. Essential hypertension blood pressure control and actually more a from the description he gave me that he does have orthostatic hypotension.  Will check his orthostasis today.  I will ask him to wear Zio patch for 2 weeks make sure he does not have any heart monitoring component of his symptomatology.  Echocardiogram will be repeated to recheck function of the valve even though physical exam was unrevealing We did talk about the fact that he need to stay well-hydrated.  Also asking to get up slowly and make sure he is fine upon getting up.  Medication Adjustments/Labs and Tests Ordered: Current medicines are reviewed at length with the patient today.  Concerns regarding medicines are outlined above.  No orders of the defined types were placed in this  encounter.  Medication changes: No orders of the defined types were placed in this encounter.   Signed, Georgeanna Lea, MD, [facility] 02/06/2021 8:41 AM    [facility]

## 2021-02-06 NOTE — Patient Instructions (Signed)
Medication Instructions:  °Your physician recommends that you continue on your current medications as directed. Please refer to the Current Medication list given to you today. ° °*If you need a refill on your cardiac medications before your next appointment, please call your pharmacy* ° ° °Lab Work: °None °If you have labs (blood work) drawn today and your tests are completely normal, you will receive your results only by: °MyChart Message (if you have MyChart) OR °A paper copy in the mail °If you have any lab test that is abnormal or we need to change your treatment, we will call you to review the results. ° ° °Testing/Procedures: °Your physician has requested that you have an echocardiogram. Echocardiography is a painless test that uses sound waves to create images of your heart. It provides your doctor with information about the size and shape of your heart and how well your heart’s chambers and valves are working. This procedure takes approximately one hour. There are no restrictions for this procedure. ° °A zio monitor was ordered today. It will remain on for 14 days. You will then return monitor and event diary in provided box. It takes 1-2 weeks for report to be downloaded and returned to us. We will call you with the results. If monitor falls off or has orange flashing light, please call Zio for further instructions.  ° ° ° °Follow-Up: °At [facility], you and your health needs are our priority.  As part of our continuing mission to provide you with exceptional heart care, we have created designated Provider Care Teams.  These Care Teams include your primary Cardiologist (physician) and Advanced Practice Providers (APPs -  Physician Assistants and Nurse Practitioners) who all work together to provide you with the care you need, when you need it. ° °We recommend signing up for the patient portal called "MyChart".  Sign up information is provided on this After Visit Summary.  MyChart is used to connect with  patients for Virtual Visits (Telemedicine).  Patients are able to view lab/test results, encounter notes, upcoming appointments, etc.  Non-urgent messages can be sent to your provider as well.   °To learn more about what you can do with MyChart, go to [email or web address].   ° °Your next appointment:   °6 month(s) ° °The format for your next appointment:   °In Person ° °Provider:   °Robert Krasowski, MD  ° ° °Other Instructions °Echocardiogram °An echocardiogram is a test that uses sound waves (ultrasound) to produce images of the heart. °Images from an echocardiogram can provide important information about: °Heart size and shape. °The size and thickness and movement of your heart's walls. °Heart muscle function and strength. °Heart valve function or if you have stenosis. Stenosis is when the heart valves are too narrow. °If blood is flowing backward through the heart valves (regurgitation). °A tumor or infectious growth around the heart valves. °Areas of heart muscle that are not working well because of poor blood flow or injury from a heart attack. °Aneurysm detection. An aneurysm is a weak or damaged part of an artery wall. The wall bulges out from the normal force of blood pumping through the body. °Tell a health care provider about: °Any allergies you have. °All medicines you are taking, including vitamins, herbs, eye drops, creams, and over-the-counter medicines. °Any blood disorders you have. °Any surgeries you have had. °Any medical conditions you have. °Whether you are pregnant or may be pregnant. °What are the risks? °Generally, this is a safe test. However, problems   may occur, including an allergic reaction to dye (contrast) that may be used during the test. °What happens before the test? °No specific preparation is needed. You may eat and drink normally. °What happens during the test? ° °You will take off your clothes from the waist up and put on a hospital gown. °Electrodes or electrocardiogram  (ECG)patches may be placed on your chest. The electrodes or patches are then connected to a device that monitors your heart rate and rhythm. °You will lie down on a table for an ultrasound exam. A gel will be applied to your chest to help sound waves pass through your skin. °A handheld device, called a transducer, will be pressed against your chest and moved over your heart. The transducer produces sound waves that travel to your heart and bounce back (or "echo" back) to the transducer. These sound waves will be captured in real-time and changed into images of your heart that can be viewed on a video monitor. The images will be recorded on a computer and reviewed by your health care provider. °You may be asked to change positions or hold your breath for a short time. This makes it easier to get different views or better views of your heart. °In some cases, you may receive contrast through an IV in one of your veins. This can improve the quality of the pictures from your heart. °The procedure may vary among health care providers and hospitals. °What can I expect after the test? °You may return to your normal, everyday life, including diet, activities, and medicines, unless your health care provider tells you not to do that. °Follow these instructions at home: °It is up to you to get the results of your test. Ask your health care provider, or the department that is doing the test, when your results will be ready. °Keep all follow-up visits. This is important. °Summary °An echocardiogram is a test that uses sound waves (ultrasound) to produce images of the heart. °Images from an echocardiogram can provide important information about the size and shape of your heart, heart muscle function, heart valve function, and other possible heart problems. °You do not need to do anything to prepare before this test. You may eat and drink normally. °After the echocardiogram is completed, you may return to your normal, everyday  life, unless your health care provider tells you not to do that. °This information is not intended to replace advice given to you by your health care provider. Make sure you discuss any questions you have with your health care provider. °Document Revised: 10/02/2020 Document Reviewed: 09/12/2019 °Elsevier Patient Education © 2022 [employer] ° ° °

## 2021-02-21 ENCOUNTER — Ambulatory Visit (INDEPENDENT_AMBULATORY_CARE_PROVIDER_SITE_OTHER): Payer: BC Managed Care – PPO

## 2021-02-21 ENCOUNTER — Other Ambulatory Visit: Payer: Self-pay

## 2021-02-21 DIAGNOSIS — I5032 Chronic diastolic (congestive) heart failure: Secondary | ICD-10-CM

## 2021-02-21 DIAGNOSIS — G4733 Obstructive sleep apnea (adult) (pediatric): Secondary | ICD-10-CM

## 2021-02-21 DIAGNOSIS — Z953 Presence of xenogenic heart valve: Secondary | ICD-10-CM | POA: Diagnosis not present

## 2021-02-21 DIAGNOSIS — I10 Essential (primary) hypertension: Secondary | ICD-10-CM

## 2021-02-21 DIAGNOSIS — E785 Hyperlipidemia, unspecified: Secondary | ICD-10-CM

## 2021-02-21 LAB — ECHOCARDIOGRAM COMPLETE
AR max vel: 2.47 cm2
AV Area VTI: 2.57 cm2
AV Area mean vel: 2.21 cm2
AV Mean grad: 9.5 mmHg
AV Peak grad: 14.8 mmHg
Ao pk vel: 1.93 m/s
Area-P 1/2: 3.07 cm2
S' Lateral: 3.2 cm

## 2021-03-12 ENCOUNTER — Telehealth: Payer: Self-pay

## 2021-03-12 MED ORDER — METOPROLOL SUCCINATE ER 50 MG PO TB24: 75.0000 mg | ORAL_TABLET | Freq: Every day | ORAL | 1 refills | Status: DC

## 2021-03-12 MED ORDER — METOPROLOL SUCCINATE ER 50 MG PO TB24: 75.0000 mg | ORAL_TABLET | Freq: Every day | ORAL | 0 refills | Status: DC

## 2021-03-12 NOTE — Telephone Encounter (Signed)
Patient notified of results and recommendations and agreed with plan. 30 day supply sent to [facility] in [place] for short term and 90 supply sent to [facility] per patient request. He is aware to stop Metoprolol Tartrate when he starts Succinate.

## 2021-03-12 NOTE — Telephone Encounter (Signed)
-----   Message from Park Liter, MD sent at 03/12/2021 10:54 AM EST ----- Monitor showed 1 episode of ventricular tachycardia for episode of supraventricular tachycardia.  I recommend increased dose of metoprolol succinate to 75 mg daily

## 2021-05-05 ENCOUNTER — Encounter: Payer: Self-pay | Admitting: Cardiology

## 2021-05-05 NOTE — Telephone Encounter (Signed)
Please advise on antibotics for dental procedure. ? ?CARDIOTHORACIC SURGERY OPERATIVE NOTE ?  ?Date of Procedure:                06/04/2020 ?  ?Preoperative Diagnosis:      Severe Aortic Insufficiency ?  ?Postoperative Diagnosis:    Same  ?  ?Procedure:      ?  ?Aortic Valve Replacement ?            Edwards Inspiris Resilia stented bovine pericardial tissue valve (size 27 mm, ref # [id number], serial # [id number]) ?             ?Surgeon:        Valentina Gu. Roxy Manns, MD ?  ?Assistant:       Ellwood Handler, PA-C ?  ?Anesthesia:    Roderic Palau, MD ?  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  ?  ?  ?  ?  ?  ?  ?  ?BRIEF CLINICAL NOTE AND INDICATIONS FOR SURGERY ?  ?Patient is 56 year old moderately obese male with history of hypertension, hyperlipidemia, and obstructive sleep apnea on CPAP who has been referred for surgical consultation to discuss treatment options for management of severe aortic insufficiency. ?  ?Patient states that he was first noted to have a heart murmur approximately 5 or 6 years ago.  He underwent echocardiogram and stress testing at that time and was found to have moderate to severe aortic insufficiency with preserved left ventricular systolic function.  He has remained essentially asymptomatic and followed carefully ever since by Dr. Agustin Cree.  Recent follow-up echocardiogram revealed severe aortic insufficiency with preserved left ventricular systolic function but significant increase in left ventricular chamber size over the last 2 years.  The patient subsequently underwent diagnostic cardiac catheterization which confirmed the presence of severe aortic insufficiency and revealed mild nonobstructive coronary artery disease with normal right heart pressures.  Cardiothoracic surgical consultation was requested. ?  ?The patient has  been seen in consultation and counseled at length regarding the indications, risks and potential benefits of surgery.  All questions have been answered, and the patient provides full informed consent for the operation as described. ?  ?  ?  ?DETAILS OF THE OPERATIVE PROCEDURE ?  ?Preparation: ?  ?The patient is brought to the operating room on the above mentioned date and central monitoring was established by the anesthesia team including placement of Swan-Ganz catheter and radial arterial line. The patient is placed in the supine position on the operating table.  Intravenous antibiotics are administered. General endotracheal anesthesia is induced uneventfully. A Foley catheter is placed. ?  ?Baseline transesophageal echocardiogram was performed.  Findings were notable for trileaflet aortic valve with severe aortic insufficiency.  There appeared to be prolapse involving all 3 cusps of the aortic valve.  The annulus was relatively large in size but not pathologically dilated.  Left ventricular function was normal.  The left ventricle was mildly dilated.  No other abnormalities were noted. ?  ?The patient's chest, abdomen, both groins, and both lower extremities are prepared and draped in a sterile manner. A time out procedure is performed. ?  ?  ?Percutaneous Vascular Access: ?  ?Percutaneous venous access were obtained on the right side.  Using ultrasound guidance the right common femoral vein was cannulated using the Seldinger technique and a pair of Perclose vascular closure devises were placed at opposing  30 degree angles, after which time an 8 [place] sheath inserted. The right internal jugular vein was cannulated  using ultrasound guidance and an 8 French sheath inserted.   ?  ?  ?Surgical Approach: ?  ?A median sternotomy incision was performed and the pericardium is opened. The ascending aorta is normal in appearance.  ?  ?  ?Extracorporeal Cardiopulmonary Bypass and Myocardial Protection: ?  ?The patient was  heparinized systemically.  The distal ascending aorta is cannulated with a 20 French arterial cannula.  The right common femoral vein is cannulated through the venous sheath and a guidewire advanced into the right atrium using TEE guidance.  The femoral vein cannulated using a 25 Fr long femoral venous cannula.  The right internal jugular vein is cannulated through the venous sheath and a guidewire advanced into the right atrium.  The internal jugular vein is cannulated using a 15 [place] pediatric femoral venous cannula.   Adequate heparinization is verified.  A retrograde cardioplegia cannula is placed through the right atrium into the coronary sinus.  The operative field was continuously flooded with carbon dioxide gas. ?  ?The entire pre-bypass portion of the operation was notable for stable hemodynamics. ?  ?Cardiopulmonary bypass was begun and the surface of the heart is inspected.  A left ventricular vent is placed through the right superior pulmonary vein.  A temperature probe was placed in the interventricular septum. ?  ?The patient is cooled to 32?C systemic temperature.  The aortic cross clamp is applied and cardioplegia is delivered initially retrograde through the coronary sinus using modified [place] Nido cold blood cardioplegia (Kennestone blood cardioplegia protocol).   The initial cardioplegic arrest is rapid with early diastolic arrest.  After the patient achieves full cardiac arrest a low transverse aortotomy incision was performed and cardioplegia is administered antegrade directly into the left main and right coronary arteries using hand-held cannulas.  Myocardial protection was felt to be excellent. ?  ?  ?Aortic Valve Replacement: ?  ?The aortic valve was inspected and noted to be trileaflet with prolapse involving all 3 leaflets of the valve.  All 3 commissures have large fenestrations which had fallen, creating fracture lines of prolapse in each of the 3 leaflets.  One area there is a portion of  the valve that looks rough, possibly consistent with an old vegetation.  There is no sign of active endocarditis.  Swab cultures obtained.  The aortic valve leaflets were excised sharply.  There is no annular calcification.  The aortic annulus was sized to accept a 27 mm prosthesis.  The aortic root and left ventricle were irrigated with copious cold saline solution. ?  ?Aortic valve replacement was performed using interrupted horizontal mattress 2-0 Ethibond pledgeted sutures with pledgets in the subannular position.  An Edwards Inspiris Resilia stented bovine pericardial tissue valve (size 27 mm, ref # [id number], serial # [id number]) was implanted uneventfully. All sutures were secured using a Cor-knot device.  The valve seated appropriately with adequate space beneath the left main and right coronary artery. ?  ?  ?Procedure Completion: ?  ?The aortotomy was closed using a 2-layer closure of running 4-0 Prolene suture.  One final dose of warm retrograde "reanimation dose" cardioplegia was administered retrograde through the coronary sinus catheter while all air was evacuated through the aortic root.  The aortic cross clamp was removed after a total cross clamp time of 80 minutes. ?  ?Epicardial pacing wires are fixed to the right ventricular outflow tract and to the  right atrial appendage. The patient is rewarmed to 37?C temperature. The aortic and left ventricular vents are removed.  The patient is weaned and disconnected from cardiopulmonary bypass.  The patient's rhythm at separation from bypass was sinus.  The patient was weaned from cardiopulmonary bypass without any inotropic support. Total cardiopulmonary bypass time for the operation was 101 minutes. ?  ?Followup transesophageal echocardiogram performed after separation from bypass revealed a well-seated aortic valve prosthesis that was functioning normally and without any sign of paravalvular leak.  Left ventricular function was unchanged from  preoperatively. ?  ?The aortic and venous cannula were removed uneventfully. Protamine was administered to reverse the anticoagulation. The mediastinum and pleural space were inspected for hemostasis and irrigated w

## 2021-05-08 MED ORDER — CLINDAMYCIN HCL 300 MG PO CAPS
ORAL_CAPSULE | ORAL | 0 refills | Status: DC
Start: 2021-05-08 — End: 2021-09-23

## 2021-05-08 NOTE — Addendum Note (Signed)
Addended by: Eleonore Chiquito on: 05/08/2021 08:30 AM ? ? Modules accepted: Orders ? ?

## 2021-07-04 ENCOUNTER — Telehealth: Payer: Self-pay | Admitting: Cardiology

## 2021-07-04 ENCOUNTER — Other Ambulatory Visit: Payer: Self-pay

## 2021-07-04 MED ORDER — METOPROLOL SUCCINATE ER 50 MG PO TB24: 75.0000 mg | ORAL_TABLET | Freq: Every day | ORAL | 0 refills | Status: DC

## 2021-07-04 MED ORDER — CANDESARTAN CILEXETIL 8 MG PO TABS: 8.0000 mg | ORAL_TABLET | Freq: Every morning | ORAL | 0 refills | Status: AC

## 2021-07-04 NOTE — Telephone Encounter (Signed)
*  STAT* If patient is at the pharmacy, call can be transferred to refill team.   1. Which medications need to be refilled? (please list name of each medication and dose if known)   candesartan (ATACAND) 8 MG tablet    metoprolol succinate (TOPROL-XL) 50 MG 24 hr tablet (Expired   2. Which pharmacy/location (including street and city if local pharmacy) is medication to be sent to? [id number] [place] - [address] AT [address]  3. Do they need a 30 day or 90 day supply?  1 week  Pt states that he is out of town and forgot medication at home. Pt would also like a call when medication is being sent to pharmacy. Please advise

## 2021-07-10 ENCOUNTER — Other Ambulatory Visit: Payer: Self-pay | Admitting: Cardiology

## 2021-07-21 ENCOUNTER — Other Ambulatory Visit: Payer: Self-pay

## 2021-07-21 MED ORDER — METOPROLOL SUCCINATE ER 50 MG PO TB24: 75.0000 mg | ORAL_TABLET | Freq: Every day | ORAL | 3 refills | Status: DC

## 2021-09-23 ENCOUNTER — Other Ambulatory Visit: Payer: Self-pay

## 2021-09-24 ENCOUNTER — Ambulatory Visit: Payer: BC Managed Care – PPO | Admitting: Cardiology

## 2021-09-24 ENCOUNTER — Encounter: Payer: Self-pay | Admitting: Cardiology

## 2021-09-24 VITALS — BP 122/88 | HR 69 | Ht 70.0 in | Wt 282.2 lb

## 2021-09-24 DIAGNOSIS — R911 Solitary pulmonary nodule: Secondary | ICD-10-CM | POA: Diagnosis not present

## 2021-09-24 DIAGNOSIS — I5032 Chronic diastolic (congestive) heart failure: Secondary | ICD-10-CM | POA: Diagnosis not present

## 2021-09-24 DIAGNOSIS — Z953 Presence of xenogenic heart valve: Secondary | ICD-10-CM | POA: Diagnosis not present

## 2021-09-24 DIAGNOSIS — I10 Essential (primary) hypertension: Secondary | ICD-10-CM

## 2021-09-24 NOTE — Progress Notes (Signed)
Cardiology Office Note:    Date:  09/24/2021   ID:  Jackson Roberts, DOB 1965/08/26, MRN [id number]  PCP:  Nonnie Done., MD  Cardiologist:  Gypsy Balsam, MD    Referring MD: Nonnie Done., MD   Chief Complaint  Patient presents with   Follow-up  Doing well  History of Present Illness:    Jackson Roberts is a 56 y.o. male  with past medical history significant for aortic insufficiency that I being watching for many years however eventually he ended up developing enlargement of the left ventricle and decision was made to pursue surgical intervention.  On Jun 04, 2020 he was taken to the operating room and he did have aortic valve replacement with 27 mm Edwards InspirEase Resilia bioprosthetic valve.  Recovery was uncomplicated except for some reaction to antibiotic but overall seems to be doing quite well. Comes today to muscle follow-up.  Overall doing very well.  Denies have any chest pain tightness squeezing pressure burning chest.  He exercise on the elliptical at least 3 times a week and he is enjoying it for half an hour.  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   Dizziness 01/17/2020   Dyslipidemia    Emphysema of lung (HCC)    Essential hypertension    Fatty liver    GERD (gastroesophageal reflux disease)    Heart murmur    due to arotic insufficiency   Incidental pulmonary nodule, > 48mm and < 51mm 05/01/2020   Right middle lobe - needs f/u CT   Mixed hyperlipidemia    Nonrheumatic aortic valve insufficiency    Plantar fasciitis of right foot 07/17/2015   S/P aortic valve replacement with bioprosthetic valve 06/04/2020   27 mm [employer] Inspiris Resilia Bioprosthetic Valve  SN: [id number]   S/P AVR (aortic valve replacement) 06/04/2020   Sleep apnea    wears CPAP    Past Surgical History:  Procedure Laterality Date   AORTIC VALVE REPLACEMENT N/A 06/04/2020    Procedure: AORTIC VALVE REPLACEMENT (AVR)  using Inspiris Valve size 40mm;  Surgeon: Purcell Nails, MD;  Location: [facility] OR;  Service: Open Heart Surgery;  Laterality: N/A;   RIGHT/LEFT HEART CATH AND CORONARY ANGIOGRAPHY N/A 03/15/2020   Procedure: RIGHT/LEFT HEART CATH AND CORONARY ANGIOGRAPHY;  Surgeon: Tonny Bollman, MD;  Location: [facility] INVASIVE CV LAB;  Service: Cardiovascular;  Laterality: N/A;   TEE WITHOUT CARDIOVERSION N/A 05/08/2020   Procedure: TRANSESOPHAGEAL ECHOCARDIOGRAM (TEE);  Surgeon: Thurmon Fair, MD;  Location: [facility] ENDOSCOPY;  Service: Cardiovascular;  Laterality: N/A;   TEE WITHOUT CARDIOVERSION N/A 06/04/2020   Procedure: TRANSESOPHAGEAL ECHOCARDIOGRAM (TEE);  Surgeon: Purcell Nails, MD;  Location: [facility] OR;  Service: Open Heart Surgery;  Laterality: N/A;    Current Medications: Current Meds  Medication Sig   acetaminophen (TYLENOL) 500 MG tablet Take 1-2 tablets (500-1,000 mg total) by mouth every 6 (six) hours as needed (pain). (Patient taking differently: Take 500-1,000 mg by mouth every 6 (six) hours as needed for mild pain or moderate pain (pain).)   albuterol (VENTOLIN HFA) 108 (90 Base) MCG/ACT inhaler Inhale 2 puffs into the lungs every 6 (six) hours as needed for wheezing or shortness of breath.   Ascorbic Acid (VITAMIN C) 1000 MG tablet Take 1,000 mg by mouth in the morning.   aspirin 325 MG EC tablet Take 1 tablet (325 mg total) by mouth daily.   candesartan (  ATACAND) 8 MG tablet Take 1 tablet (8 mg total) by mouth in the morning.   cetirizine-pseudoephedrine (ZYRTEC-D) 5-120 MG tablet Take 1 tablet by mouth 2 (two) times daily as needed for allergies.   Cholecalciferol (VITAMIN D3) 5000 units TABS Take 5,000 Units by mouth in the morning.   dextromethorphan-guaiFENesin (MUCINEX DM) 30-600 MG 12hr tablet Take 1 tablet by mouth 2 (two) times daily as needed for cough (congestion).   EPINEPHrine 0.3 mg/0.3 mL IJ SOAJ injection Inject 0.3 mg into the muscle as needed  for anaphylaxis.   lansoprazole (PREVACID) 15 MG capsule Take 15 mg by mouth daily.   metoprolol succinate (TOPROL-XL) 50 MG 24 hr tablet Take 1.5 tablets (75 mg total) by mouth daily. Take with or immediately following a meal.   Multiple Vitamin (MULTIVITAMIN WITH MINERALS) TABS tablet Take 1 tablet by mouth in the morning. Centrum for Men/Unknown sterenght   pravastatin (PRAVACHOL) 10 MG tablet Take 5 mg by mouth in the morning.   psyllium (METAMUCIL SMOOTH TEXTURE) 28 % packet Take 1 packet by mouth in the morning. Unknown strenght   sildenafil (REVATIO) 20 MG tablet Take 20 mg by mouth daily as needed (erectile dysfunction).     Allergies:   Penicillin g and Vancomycin   Social History   Socioeconomic History   Marital status: Married    Spouse name: Not on file   Number of children: Not on file   Years of education: Not on file   Highest education level: Not on file  Occupational History   Not on file  Tobacco Use   Smoking status: Former    Packs/day: 1.00    Years: 15.00    Total pack years: 15.00    Types: Cigarettes    Quit date: 09/11/2007    Years since quitting: 14.0   Smokeless tobacco: Never  Vaping Use   Vaping Use: Never used  Substance and Sexual Activity   Alcohol use: Yes    Alcohol/week: 1.0 standard drink of alcohol    Types: 1 Cans of beer per week    Comment: socially   Drug use: Never   Sexual activity: Not on file  Other Topics Concern   Not on file  Social History Narrative   Not on file   Social Determinants of Health   Financial Resource Strain: Not on file  Food Insecurity: Not on file  Transportation Needs: Not on file  Physical Activity: Not on file  Stress: Not on file  Social Connections: Not on file     Family History: The patient's family history includes Bone cancer in his maternal grandfather; Hypertension in his father and mother; Mitral valve prolapse in his mother; Stroke in his maternal grandfather. ROS:   Please see the  history of present illness.    All 14 point review of systems negative except as described per history of present illness  EKGs/Labs/Other Studies Reviewed:      Recent Labs: No results found for requested labs within last 365 days.  Recent Lipid Panel No results found for: "CHOL", "TRIG", "HDL", "CHOLHDL", "VLDL", "LDLCALC", "LDLDIRECT"  Physical Exam:    VS:  BP 122/88 (BP Location: Left Arm, Patient Position: Sitting)   Pulse 69   Ht 5\' 10"  (1.778 m)   Wt 282 lb 3.2 oz (128 kg)   SpO2 97%   BMI 40.49 kg/m     Wt Readings from Last 3 Encounters:  09/24/21 282 lb 3.2 oz (128 kg)  02/06/21 275 lb (  124.7 kg)  08/22/20 266 lb (120.7 kg)     GEN:  Well nourished, well developed in no acute distress HEENT: Normal NECK: No JVD; No carotid bruits LYMPHATICS: No lymphadenopathy CARDIAC: RRR, no murmurs, no rubs, no gallops RESPIRATORY:  Clear to auscultation without rales, wheezing or rhonchi  ABDOMEN: Soft, non-tender, non-distended MUSCULOSKELETAL:  No edema; No deformity  SKIN: Warm and dry LOWER EXTREMITIES: no swelling NEUROLOGIC:  Alert and oriented x 3 PSYCHIATRIC:  Normal affect   ASSESSMENT:    1. S/P aortic valve replacement with bioprosthetic valve   2. Essential hypertension   3. Chronic diastolic congestive heart failure (HCC)   4. Incidental pulmonary nodule, > 59mm and < 31mm    PLAN:    In order of problems listed above:  Status post aortic valve replacement bioprosthetic.  Last echocardiogram done in January.  We will schedule him to have another echocardiogram in January next year and shortly after that follow-up will be done. Essential hypertension blood pressure well controlled continue present management. Chronic diastolic congestive heart failure controlled. Dyslipidemia he is taking pravastatin he had difficulty tolerating different medications.  I will call primary care physician to get fasting lipid profile Right middle pulmonary nodule noted on  the coronary CT angio when he had done in 2022.  He need to have a follow-up if he did not have repeated CT of the chest will do the test Patient told me that he had just had CT of the chest done I was able to look at the records in [facility] and retrieve his CT done on 08/11/2021.  There was mild reduction in size of the right middle lobe nodule which indicates benign etiology, no future follow-up needed.   Medication Adjustments/Labs and Tests Ordered: Current medicines are reviewed at length with the patient today.  Concerns regarding medicines are outlined above.  No orders of the defined types were placed in this encounter.  Medication changes: No orders of the defined types were placed in this encounter.   Signed, Jackson Lea, MD, [facility] 09/24/2021 9:32 AM    [facility]

## 2021-09-24 NOTE — Addendum Note (Signed)
Addended by: Baldo Ash D on: 09/24/2021 09:44 AM   Modules accepted: Orders

## 2021-09-24 NOTE — Patient Instructions (Addendum)

## 2022-03-13 ENCOUNTER — Ambulatory Visit: Payer: BC Managed Care – PPO | Attending: Cardiology

## 2022-03-13 DIAGNOSIS — Z953 Presence of xenogenic heart valve: Secondary | ICD-10-CM | POA: Diagnosis not present

## 2022-03-13 LAB — ECHOCARDIOGRAM COMPLETE
AR max vel: 2.05 cm2
AV Area VTI: 2.05 cm2
AV Area mean vel: 2.01 cm2
AV Mean grad: 8.3 mmHg
AV Peak grad: 13.3 mmHg
Ao pk vel: 1.82 m/s
Area-P 1/2: 4.19 cm2
S' Lateral: 3.4 cm

## 2022-03-13 NOTE — Progress Notes (Signed)
Patient ID: Jackson Roberts, male   DOB: 03-03-1965, 57 y.o.   MRN: [id number] Examination chaperoned by Philipp Deputy, Jerl Santos.

## 2022-03-16 ENCOUNTER — Telehealth: Payer: Self-pay

## 2022-03-16 NOTE — Telephone Encounter (Signed)
Results reviewed with pt as per Dr. Krasowski's note.  Pt verbalized understanding and had no additional questions. Routed to PCP  

## 2022-03-27 ENCOUNTER — Encounter: Payer: Self-pay | Admitting: Cardiology

## 2022-03-27 ENCOUNTER — Ambulatory Visit: Payer: BC Managed Care – PPO | Attending: Cardiology | Admitting: Cardiology

## 2022-03-27 VITALS — BP 130/84 | HR 77 | Ht 70.0 in | Wt 290.0 lb

## 2022-03-27 DIAGNOSIS — E785 Hyperlipidemia, unspecified: Secondary | ICD-10-CM

## 2022-03-27 DIAGNOSIS — Z952 Presence of prosthetic heart valve: Secondary | ICD-10-CM

## 2022-03-27 DIAGNOSIS — I10 Essential (primary) hypertension: Secondary | ICD-10-CM | POA: Diagnosis not present

## 2022-03-27 DIAGNOSIS — G4733 Obstructive sleep apnea (adult) (pediatric): Secondary | ICD-10-CM | POA: Diagnosis not present

## 2022-03-27 NOTE — Progress Notes (Signed)
Cardiology Office Note:    Date:  03/27/2022   ID:  Jackson Roberts, DOB 09-02-1965, MRN [id number]  PCP:  Enid Skeens., MD  Cardiologist:  Jenne Campus, MD    Referring MD: Enid Skeens., MD   No chief complaint on file. Doing well  History of Present Illness:    Jackson Roberts is a 57 y.o. male   with past medical history significant for aortic insufficiency that I being watching for many years however eventually he ended up developing enlargement of the left ventricle and decision was made to pursue surgical intervention.  On Jun 04, 2020 he was taken to the operating room and he did have aortic valve replacement with 27 mm Edwards InspirEase Resilia bioprosthetic valve.  Recovery was uncomplicated except for some reaction to antibiotic but overall seems to be doing quite well.  Comes today to months for follow-up we will doing very well.  Denies of any chest pain tightness squeezing pressure mid chest no palpitations dizziness swelling of lower extremities.  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   Dizziness 01/17/2020   Dyslipidemia    Emphysema of lung (HCC)    Essential hypertension    Fatty liver    GERD (gastroesophageal reflux disease)    Heart murmur    due to arotic insufficiency   Incidental pulmonary nodule, > 80m and < 868m3/30/2022   Right middle lobe - needs f/u CT   Mixed hyperlipidemia    Nonrheumatic aortic valve insufficiency    Plantar fasciitis of right foot 07/17/2015   S/P aortic valve replacement with bioprosthetic valve 06/04/2020   27 mm EdSpearvillenspiris Resilia Bioprosthetic Valve  SN: [id number] S/P AVR (aortic valve replacement) 06/04/2020   Sleep apnea    wears CPAP    Past Surgical History:  Procedure Laterality Date    AORTIC VALVE REPLACEMENT N/A 06/04/2020   Procedure: AORTIC VALVE REPLACEMENT (AVR)  using Inspiris Valve size 2752m Surgeon: OweRexene AlbertsD;  [facility];  Laterality: N/A;   RIGHT/LEFT HEART CATH AND CORONARY ANGIOGRAPHY N/A 03/15/2020   Procedure: RIGHT/LEFT HEART CATH AND CORONARY ANGIOGRAPHY;  Surgeon: CooSherren MochaD;  Location: [facility] LAB;  Service: Cardiovascular;  Laterality: N/A;   TEE WITHOUT CARDIOVERSION N/A 05/08/2020   Procedure: TRANSESOPHAGEAL ECHOCARDIOGRAM (TEE);  Surgeon: CroSanda KleinD;  [facility];  Laterality: N/A;   TEE WITHOUT CARDIOVERSION N/A 06/04/2020   Procedure: TRANSESOPHAGEAL ECHOCARDIOGRAM (TEE);  Surgeon: OweRexene AlbertsD;  [facility] [place];  Laterality: N/A;    Current Medications: Current Meds  Medication Sig   acetaminophen (TYLENOL) 500 MG tablet Take 1-2 tablets (500-1,000 mg total) by mouth every 6 (six) hours as needed (pain). (Patient taking differently: Take 500-1,000 mg by mouth every 6 (six) hours as needed for mild pain or moderate pain (pain).)   albuterol (VENTOLIN HFA) 108 (90 Base) MCG/ACT inhaler Inhale 2 puffs into the lungs every 6 (six) hours as needed for wheezing or shortness of breath.   Ascorbic Acid (VITAMIN C) 1000 MG tablet Take 1,000 mg by mouth in the morning.   aspirin 325 MG EC tablet Take 1 tablet (325 mg total) by mouth  daily.   candesartan (ATACAND) 8 MG tablet Take 1 tablet (8 mg total) by mouth in the morning.   cetirizine-pseudoephedrine (ZYRTEC-D) 5-120 MG tablet Take 1 tablet by mouth 2 (two) times daily as needed for allergies.   Cholecalciferol (VITAMIN D3) 5000 units TABS Take 5,000 Units by mouth in the morning.   dextromethorphan-guaiFENesin (MUCINEX DM) 30-600 MG 12hr tablet Take 1 tablet by mouth 2 (two) times daily as needed for cough (congestion).   EPINEPHrine 0.3 mg/0.3 mL IJ SOAJ  injection Inject 0.3 mg into the muscle as needed for anaphylaxis.   lansoprazole (PREVACID) 15 MG capsule Take 15 mg by mouth daily.   metoprolol succinate (TOPROL-XL) 50 MG 24 hr tablet Take 1.5 tablets (75 mg total) by mouth daily. Take with or immediately following a meal.   Multiple Vitamin (MULTIVITAMIN WITH MINERALS) TABS tablet Take 1 tablet by mouth in the morning. Centrum for Men/Unknown sterenght   pravastatin (PRAVACHOL) 10 MG tablet Take 5 mg by mouth in the morning.   psyllium (METAMUCIL SMOOTH TEXTURE) 28 % packet Take 1 packet by mouth in the morning. Unknown strenght   sildenafil (REVATIO) 20 MG tablet Take 20 mg by mouth daily as needed (erectile dysfunction).     Allergies:   Penicillin g and Vancomycin   Social History   Socioeconomic History   Marital status: Married    Spouse name: Not on file   Number of children: Not on file   Years of education: Not on file   Highest education level: Not on file  Occupational History   Not on file  Tobacco Use   Smoking status: Former    Packs/day: 1.00    Years: 15.00    Total pack years: 15.00    Types: Cigarettes    Quit date: 09/11/2007    Years since quitting: 14.5   Smokeless tobacco: Never  Vaping Use   Vaping Use: Never used  Substance and Sexual Activity   Alcohol use: Yes    Alcohol/week: 1.0 standard drink of alcohol    Types: 1 Cans of beer per week    Comment: socially   Drug use: Never   Sexual activity: Not on file  Other Topics Concern   Not on file  Social History Narrative   Not on file   Social Determinants of Health   Financial Resource Strain: Not on file  Food Insecurity: Not on file  Transportation Needs: Not on file  Physical Activity: Not on file  Stress: Not on file  Social Connections: Not on file     Family History: The patient's family history includes Bone cancer in his maternal grandfather; Hypertension in his father and mother; Mitral valve prolapse in his mother; Stroke in  his maternal grandfather. ROS:   Please see the history of present illness.    All 14 point review of systems negative except as described per history of present illness  EKGs/Labs/Other Studies Reviewed:      Recent Labs: No results found for requested labs within last 365 days.  Recent Lipid Panel No results found for: "CHOL", "TRIG", "HDL", "CHOLHDL", "VLDL", "LDLCALC", "LDLDIRECT"  Physical Exam:    VS:  BP 130/84 (BP Location: Right Arm, Patient Position: Sitting)   Pulse 77   Ht '5\' 10"'$  (1.778 m)   Wt 290 lb (131.5 kg)   SpO2 96%   BMI 41.61 kg/m     Wt Readings from Last 3 Encounters:  03/27/22 290 lb (131.5 kg)  09/24/21 282 lb  3.2 oz (128 kg)  02/06/21 275 lb (124.7 kg)     GEN:  Well nourished, well developed in no acute distress HEENT: Normal NECK: No JVD; No carotid bruits LYMPHATICS: No lymphadenopathy CARDIAC: RRR, no murmurs, no rubs, no gallops RESPIRATORY:  Clear to auscultation without rales, wheezing or rhonchi  ABDOMEN: Soft, non-tender, non-distended MUSCULOSKELETAL:  No edema; No deformity  SKIN: Warm and dry LOWER EXTREMITIES: no swelling NEUROLOGIC:  Alert and oriented x 3 PSYCHIATRIC:  Normal affect   ASSESSMENT:    1. S/P AVR (aortic valve replacement)   2. Dyslipidemia   3. Essential hypertension   4. Obstructive sleep apnea syndrome    PLAN:    In order of problems listed above:  Status post arctic valve replacement doing well echocardiogram reviewed done just few days ago normal valve function Dyslipidemia I do not have any fasting lipid profile recently, will call primary care physician to get a copy of it. Essential hypertension blood pressure well-controlled continue present management. Obstructive sleep apnea that being followed by primary care physician.   Medication Adjustments/Labs and Tests Ordered: Current medicines are reviewed at length with the patient today.  Concerns regarding medicines are outlined above.  No  orders of the defined types were placed in this encounter.  Medication changes: No orders of the defined types were placed in this encounter.   Signed, Park Liter, MD, [facility] 03/27/2022 9:20 AM    [place]

## 2022-03-27 NOTE — Patient Instructions (Signed)

## 2022-09-25 ENCOUNTER — Ambulatory Visit: Payer: BC Managed Care – PPO | Admitting: Cardiology

## 2022-09-30 NOTE — Progress Notes (Unsigned)
Cardiology Office Note:  .   Date:  10/01/2022  ID:  Arbutus Leas, DOB Dec 14, 1965, MRN [id number] PCP: Nonnie Done., MD  [facility] Providers Cardiologist:  None    History of Present Illness: Shikhar Plucinski is a 57 y.o. male with a past medical history of hypertension, aortic valve insufficiency s/p AVR 2022, chronic diastolic heart failure, OSA, emphysema, GERD, dyslipidemia.  03/13/2022 echo EF 60 to 65%, mild concentric LVH, aortic valve normal structure without regurgitation functioning normally 02/06/2021 monitor 1 episode of ventricular tachycardia, average heart rate 87 bpm, predominant underlying rhythm was sinus, 4 episodes of SVT, recommendations to increase his metoprolol 04/21/2020 carotid ultrasound near normal with only minimal wall thickening 03/05/2020 left heart cath mild diffuse nonobstructive CAD  Most recently evaluated by Dr. Bing Matter on 03/27/2022, he was doing well from a cardiac perspective, he was trying to lose weight and somewhat frustrated that he had not lost more but was trying to exercise.  An echo had been recently obtained which revealed an EF of 60 to 65%, mild concentric LVH, aortic valve was normal in structure without regurgitation and no stenosis and was functioning normally.  He presents today for follow up of his AVR and HTN. He has been doing well from a cardiac perspective.  He has been struggling with weight loss, been able to lose about 10 pounds however cannot make the scale but much more than that.  Checked his insurance, they will not cover Wegovy.  Will refer him to healthy weight and wellness. He denies chest pain, palpitations, dyspnea, pnd, orthopnea, n, v, dizziness, syncope, edema, weight gain, or early satiety.   ROS: ROS   Studies Reviewed: .       Cardiac Studies & Procedures   CARDIAC CATHETERIZATION  CARDIAC CATHETERIZATION 03/15/2020  Narrative  There is no aortic valve stenosis. There is severe (4+)  aortic regurgitation.  1.  Patent coronary arteries with mild diffuse nonobstructive CAD as outlined, no significant stenoses are present 2.  Severe aortic valve insufficiency based on aortic root angiography 3.  Essentially normal right heart hemodynamics  Findings Coronary Findings Diagnostic  Dominance: Right  Left Main Widely patent vessel without stenosis.  Divides into the LAD and circumflex.  Left Anterior Descending Vessel was injected. Vessel is large. The vessel exhibits minimal luminal irregularities. Patent vessel to the apex of the heart, patent first diagonal branch with no stenosis, mild irregularities throughout.  Left Circumflex The circumflex is a very large vessel supplying a large obtuse marginal that divides into multiple vessels and supplies much of the lateral wall.  There is no stenosis throughout the circumflex distribution.  Right Coronary Artery Vessel is large. There is mild diffuse disease throughout the vessel. Dominant vessel, relatively smooth with areas of mild tapering in the mid vessel, no more than 30% stenosis.  Intervention  No interventions have been documented.     ECHOCARDIOGRAM  ECHOCARDIOGRAM COMPLETE 03/13/2022  Narrative ECHOCARDIOGRAM REPORT    Patient Name:   SKIP MAREZ Date of Exam: 03/13/2022 Medical Rec #:  [id number]           Height:       70.0 in Accession #:    [id number]          Weight:       282.2 lb Date of Birth:  01/08/1966          BSA:          2.416 m Patient Age:  56 years            BP:           122/88 mmHg Patient Gender: M                   HR:           68 bpm. Exam Location:    Procedure: 2D Echo, Cardiac Doppler, Color Doppler and Strain Analysis  Indications:    S/P aortic valve replacement with bioprosthetic valve [Z95.3 (ICD-10-CM)]  History:        Patient has prior history of Echocardiogram examinations, most recent 02/21/2021. CHF, Aortic Valve Disease;  Risk Factors:Dyslipidemia and Hypertension. Aortic Valve: 27 mm Inspiris Inspiris Valve size 27mm; Surgeon: Purcell Nails, MD; valve is present in the aortic position.  Sonographer:    Margreta Journey RDCS Referring Phys: [id number] Georgeanna Lea   Sonographer Comments: Suboptimal subcostal window. IMPRESSIONS   1. Left ventricular ejection fraction, by estimation, is 60 to 65%. The left ventricle has normal function. The left ventricle has no regional wall motion abnormalities. There is mild concentric left ventricular hypertrophy. Left ventricular diastolic parameters were normal. The average left ventricular global longitudinal strain is -18.6 %. The global longitudinal strain is normal. 2. Right ventricular systolic function is normal. The right ventricular size is normal. 3. The mitral valve is normal in structure. No evidence of mitral valve regurgitation. No evidence of mitral stenosis. 4. The aortic valve is normal in structure. Aortic valve regurgitation is not visualized. No aortic stenosis is present. There is a 27 mm Inspiris Inspiris Valve size 27mm; Surgeon: Purcell Nails, MD; valve present in the aortic position. Echo findings are consistent with normal structure and function of the aortic valve prosthesis. 5. There is borderline dilatation of the ascending aorta, measuring 38 mm.  FINDINGS Left Ventricle: Left ventricular ejection fraction, by estimation, is 60 to 65%. The left ventricle has normal function. The left ventricle has no regional wall motion abnormalities. The average left ventricular global longitudinal strain is -18.6 %. The global longitudinal strain is normal. The left ventricular internal cavity size was normal in size. There is mild concentric left ventricular hypertrophy. Left ventricular diastolic parameters were normal. Normal left ventricular filling pressure.  Right Ventricle: The right ventricular size is normal. No increase in right  ventricular wall thickness. Right ventricular systolic function is normal.  Left Atrium: Left atrial size was normal in size.  Right Atrium: Right atrial size was normal in size.  Pericardium: There is no evidence of pericardial effusion.  Mitral Valve: The mitral valve is normal in structure. No evidence of mitral valve regurgitation. No evidence of mitral valve stenosis.  Tricuspid Valve: The tricuspid valve is normal in structure. Tricuspid valve regurgitation is not demonstrated. No evidence of tricuspid stenosis.  Aortic Valve: The aortic valve is normal in structure. Aortic valve regurgitation is not visualized. No aortic stenosis is present. Aortic valve mean gradient measures 8.3 mmHg. Aortic valve peak gradient measures 13.3 mmHg. Aortic valve area, by VTI measures 2.05 cm. There is a 27 mm Inspiris Inspiris Valve size 27mm; Surgeon: Purcell Nails, MD; valve present in the aortic position. Echo findings are consistent with normal structure and function of the aortic valve prosthesis.  Pulmonic Valve: The pulmonic valve was normal in structure. Pulmonic valve regurgitation is not visualized. No evidence of pulmonic stenosis.  Aorta: The aortic root and ascending aorta are structurally normal, with no evidence of dilitation and the  aortic arch was not well visualized. There is borderline dilatation of the ascending aorta, measuring 38 mm.  Venous: The pulmonary veins were not well visualized. The inferior vena cava was not well visualized.  IAS/Shunts: No atrial level shunt detected by color flow Doppler.   LEFT VENTRICLE PLAX 2D LVIDd:         4.80 cm   Diastology LVIDs:         3.40 cm   LV e' medial:    7.62 cm/s LV PW:         1.10 cm   LV E/e' medial:  8.1 LV IVS:        1.20 cm   LV e' lateral:   7.62 cm/s LVOT diam:     2.70 cm   LV E/e' lateral: 8.1 LV SV:         81 LV SV Index:   33        2D Longitudinal Strain LVOT Area:     5.73 cm  2D Strain GLS Avg:      -18.6 %   RIGHT VENTRICLE RV S prime:     9.25 cm/s TAPSE (M-mode): 1.9 cm  LEFT ATRIUM           Index        RIGHT ATRIUM           Index LA diam:      3.60 cm 1.49 cm/m   RA Area:     13.30 cm LA Vol (A2C): 36.3 ml 15.02 ml/m  RA Volume:   28.20 ml  11.67 ml/m LA Vol (A4C): 27.9 ml 11.55 ml/m AORTIC VALVE                     PULMONIC VALVE AV Area (Vmax):    2.05 cm      PR End Diast Vel: 3.72 msec AV Area (Vmean):   2.01 cm AV Area (VTI):     2.05 cm AV Vmax:           182.33 cm/s AV Vmean:          138.667 cm/s AV VTI:            0.393 m AV Peak Grad:      13.3 mmHg AV Mean Grad:      8.3 mmHg LVOT Vmax:         65.30 cm/s LVOT Vmean:        48.600 cm/s LVOT VTI:          0.141 m LVOT/AV VTI ratio: 0.36  AORTA Ao Root diam: 3.90 cm Ao Asc diam:  3.80 cm Ao Desc diam: 2.30 cm  MITRAL VALVE MV Area (PHT): 4.19 cm    SHUNTS MV Decel Time: 181 msec    Systemic VTI:  0.14 m MV E velocity: 62.10 cm/s  Systemic Diam: 2.70 cm MV A velocity: 49.70 cm/s MV E/A ratio:  1.25  Norman Herrlich MD Electronically signed by Norman Herrlich MD Signature Date/Time: 03/13/2022/12:20:44 PM    Final   TEE  ECHO INTRAOPERATIVE TEE 06/04/2020  Narrative *INTRAOPERATIVE TRANSESOPHAGEAL REPORT *    Patient Name:   TAYARI RAMOS Date of Exam: 06/04/2020 Medical Rec #:  [id number]           Height:       70.0 in Accession #:    [id number]          Weight:       271.6 lb Date  of Birth:  [birth year]          BSA:          2.38 m Patient Age:    54 years            BP:           160/60 mmHg Patient Gender: M                   HR:           79 bpm. Exam Location:  Inpatient  Transesophogeal exam was perform intraoperatively during surgical procedure. Patient was closely monitored under general anesthesia during the entirety of examination.  Indications:     aortic valve replacement Performing Phys: [id number] CLARENCE H OWEN Diagnosing Phys: Gaynelle Adu  MD  Complications: No known complications during this procedure. POST-OP IMPRESSIONS - Left Ventricle: The left ventricle is unchanged from pre-bypass. - Right Ventricle: The right ventricle appears unchanged from pre-bypass. - Aortic Valve: A bovine bioprosthetic valve was placed, leaflets are freely mobile and leaflets thin Size; 27mm. No regurgitation post repair. The gradient recorded across the prosthetic valve is within the expected range. No perivalvular leak noted. - Mitral Valve: The mitral valve appears unchanged from pre-bypass. - Tricuspid Valve: The tricuspid valve appears unchanged from pre-bypass. - Pulmonic Valve: The pulmonic valve appears unchanged from pre-bypass.  PRE-OP FINDINGS Left Ventricle: The left ventricle has normal systolic function, with an ejection fraction of 55-60%. The cavity size was normal. There is no increase in left ventricular wall thickness. No evidence of left ventricular regional wall motion abnormalities.   Right Ventricle: The right ventricle has normal systolic function. The cavity was normal. There is no increase in right ventricular wall thickness.  Left Atrium: Left atrial size was not assessed. No left atrial/left atrial appendage thrombus was detected.  Right Atrium: Right atrial size was not assessed.  Interatrial Septum: No atrial level shunt detected by color flow Doppler.  Pericardium: There is no evidence of pericardial effusion.  Mitral Valve: The mitral valve is normal in structure. Mitral valve regurgitation is trivial by color flow Doppler.  Tricuspid Valve: The tricuspid valve was normal in structure. Tricuspid valve regurgitation was not visualized by color flow Doppler.  Aortic Valve: The aortic valve is tricuspid Aortic valve regurgitation is severe by color flow Doppler. The jet is eccentric anteriorly directed. There is no stenosis of the aortic valve.   Pulmonic Valve: The pulmonic valve was normal in  structure. Pulmonic valve regurgitation is not visualized by color flow Doppler.   +--------------+--------++ LEFT VENTRICLE         +--------------+--------++ PLAX 2D                +--------------+--------++ LVIDd:        5.60 cm  +--------------+--------++ LVIDs:        3.80 cm  +--------------+--------++ LVOT diam:    3.30 cm  +--------------+--------++ LV SV:        92 ml    +--------------+--------++ LV SV Index:  36.41    +--------------+--------++ LVOT Area:    8.55 cm +--------------+--------++                        +--------------+--------++  +-------------+------------++ AORTIC VALVE              +-------------+------------++ AV Vmax:     163.00 cm/s  +-------------+------------++ AV Vmean:    125.000 cm/s +-------------+------------++ AV VTI:  0.306 m      +-------------+------------++ AV Peak Grad:10.6 mmHg    +-------------+------------++ AV Mean Grad:7.0 mmHg     +-------------+------------++ AR PHT:      248 msec     +-------------+------------++   +--------------+-------+ SHUNTS                +--------------+-------+ Systemic Diam:3.30 cm +--------------+-------+   Gaynelle Adu MD Electronically signed by Gaynelle Adu MD Signature Date/Time: 06/04/2020/2:20:53 PM    Final   MONITORS  LONG TERM MONITOR (3-14 DAYS) 02/26/2021  Narrative Patch Wear Time:  13 days and 20 hours (2023-01-05T09:24:33-0500 to 2023-01-19T05:31:33-0500)  Patient had a min HR of 57 bpm, max HR of 203 bpm, and avg HR of 87 bpm. Predominant underlying rhythm was Sinus Rhythm. 1 run of Ventricular Tachycardia occurred lasting 6 beats with a max rate of 203 bpm (avg 165 bpm). 4 Supraventricular Tachycardia runs occurred, the run with the fastest interval lasting 4 beats with a max rate of 184 bpm, the longest lasting 4 beats with an avg rate of 156 bpm. Isolated SVEs were rare  (<1.0%), SVE Couplets were rare (<1.0%), and SVE Triplets were rare (<1.0%). Isolated VEs were rare (<1.0%), VE Couplets were rare (<1.0%), and no VE Triplets were present. Ventricular Bigeminy and Trigeminy were present.  Summary conclusions: 1 episode of ventricular tachycardia total 6 beats at rate of 203. 4 episode of supraventricular tachycardia only 4 beats. Ventricular bigeminy and trigeminy noted Triggered event showing sinus rhythm   CT SCANS  CT CORONARY MORPH W/CTA COR W/SCORE 05/01/2020  Addendum 05/01/2020 10:10 PM ADDENDUM REPORT: 05/01/2020 22:08  CLINICAL DATA:  Severe AI being evaluated for AVR  EXAM: Cardiac TAVR CT  TECHNIQUE: The patient was scanned on a [employer]. A 120 kV retrospective scan was triggered in the descending thoracic aorta at 111 HU's. Gantry rotation speed was 250 msecs and collimation was .6 mm. No beta blockade or nitro were given. The 3D data set was reconstructed in 5% intervals of the R-R cycle. Systolic and diastolic phases were analyzed on a dedicated work station using MPR, MIP and VRT modes. The patient received 80 cc of contrast.  FINDINGS: Aortic Root:  Aortic valve: Trileaflet  Aortic valve calcium score: 0  Aortic annulus:  Diameter: 41mm x 35mm  Perimeter:  Area: 1060 mm^2  Calcifications: No calcifications  Coronary height: Min Left - 20mm, Max Left - 24mm; Min Right - 22mm  Sinotubular height: Left cusp - 25mm; Right cusp - 27mm; Noncoronary cusp - 25mm  LVOT (as measured 3 mm below the annulus):  Diameter: 45mm x 34mm  Area: 1110 mm^2  Calcifications: No calcifications  Aortic sinus width: Left cusp - 43mm; Right cusp - 40mm; Noncoronary cusp - 41mm  Sinotubular junction width: 34mm x 33mm  Cardiac:  Right atrium: Mild enlargement  Right ventricle: Normal size  Pulmonary arteries: Normal size  Pulmonary veins: Normal configuration  Left atrium: Mild enlargement  Left  ventricle: Severe dilatation  Pericardium: Normal  Coronary arteries: Calcium score 138 (82nd percentile)  IMPRESSION: 1.  Trileaflet aortic valve, no calcifications  2. Aortic annulus measures 41mm x 35mm in diameter with perimeter and area 1060 mm^2. No annular calcifications  3.  Severe LV dilatation  4.  Coronary calcium score 138 (82nd percentile)   Electronically Signed By: Epifanio Lesches MD On: 05/01/2020 22:08  Narrative EXAM: OVER-READ INTERPRETATION  CT CHEST  The following report is an over-read performed by radiologist Dr.  Leanna Battles of [facility], [place] on 05/01/2020. This over-read does not include interpretation of cardiac or coronary anatomy or pathology. The coronary calcium score/coronary CTA interpretation by the cardiologist is attached.  COMPARISON:  None.  FINDINGS: Vascular: None.  Mediastinum/Nodes: No pathologically enlarged mediastinal, hilar or axillary lymph nodes. Esophagus is grossly unremarkable. Subcentimeter right thyroid nodule. No follow-up recommended (ref: J Am Coll Radiol. 2015 Feb;12(2): 143-50).  Lungs/Pleura: Mild centrilobular emphysema. 6 mm nodule along the minor fissure (5 x 7 mm, 11/39). Visualized lungs are otherwise clear. No pleural fluid. Minimal debris in the airway.  Upper Abdomen: Liver is decreased in attenuation. Visualized portions of the liver, spleen and stomach are otherwise unremarkable.  Musculoskeletal: None.  IMPRESSION: 1. 6 mm nodule along the minor fissure may represent a subpleural lymph node. Non-contrast chest CT at 6-12 months is recommended. If the nodule is stable at time of repeat CT, then future CT at 18-24 months (from today's scan) is considered optional for low-risk patients, but is recommended for high-risk patients. This recommendation follows the consensus statement: Guidelines for Management of Incidental Pulmonary Nodules Detected on CT Images: From the  Fleischner Society 2017; Radiology 2017; 284:228-243. 2. Hepatic steatosis. 3.  Emphysema (ICD10-J43.9).  Electronically Signed: By: Leanna Battles M.D. On: 05/01/2020 11:02          Risk Assessment/Calculations:             Physical Exam:   VS:  BP 118/80 (BP Location: Left Arm, Patient Position: Sitting, Cuff Size: Normal)   Pulse 72   Ht 5\' 10"  (1.778 m)   Wt 278 lb 12.8 oz (126.5 kg)   SpO2 96%   BMI 40.00 kg/m    Wt Readings from Last 3 Encounters:  10/01/22 278 lb 12.8 oz (126.5 kg)  03/27/22 290 lb (131.5 kg)  09/24/21 282 lb 3.2 oz (128 kg)    GEN: Well nourished, well developed in no acute distress NECK: No JVD; No carotid bruits CARDIAC: RRR, no murmurs, rubs, gallops RESPIRATORY:  Clear to auscultation without rales, wheezing or rhonchi  ABDOMEN: Soft, non-tender, non-distended EXTREMITIES:  No edema; No deformity   ASSESSMENT AND PLAN: .   S/p AVR-recent echocardiogram in February revealed device functioning appropriately.  Has upcoming dental procedure, will send in SBE for this. Dyslipidemia-LDL in June is elevated at 109, normal LFTs.  Will increase his pravastatin to 20 mg daily.  Repeat LFTs and FLP's in 6 weeks.  Would prefer his LDL to be less than 70. CAD-nonobstructive per left heart cath in 2022, Stable with no anginal symptoms. No indication for ischemic evaluation.  Increasing his statin per above, he has had difficulty tolerating aspirin secondary to nosebleeds, will take aspirin 81 mg daily 3 days a week, continue metoprolol 75 mg daily. Hypertension-blood pressure well-controlled at 118/80, continue candesartan 8 mg daily. Obesity - BMI is 40, he is trying to augment his diet and increase physical activity. Reginal Lutes is not covered by his insurance plan. Will refer him to healthy weight and wellness.        Dispo: Azithromycin for SBE, increase pravastatin to 20 mg daily, repeat FLP and LFTs in 6 weeks, ambulatory referral to healthy weight and  wellness.  Signed, Flossie Dibble, NP

## 2022-10-01 ENCOUNTER — Ambulatory Visit: Payer: BC Managed Care – PPO | Attending: Cardiology | Admitting: Cardiology

## 2022-10-01 ENCOUNTER — Encounter: Payer: Self-pay | Admitting: Cardiology

## 2022-10-01 VITALS — BP 118/80 | HR 72 | Ht 70.0 in | Wt 278.8 lb

## 2022-10-01 DIAGNOSIS — Z953 Presence of xenogenic heart valve: Secondary | ICD-10-CM

## 2022-10-01 DIAGNOSIS — E785 Hyperlipidemia, unspecified: Secondary | ICD-10-CM | POA: Diagnosis not present

## 2022-10-01 DIAGNOSIS — Z952 Presence of prosthetic heart valve: Secondary | ICD-10-CM

## 2022-10-01 DIAGNOSIS — G4733 Obstructive sleep apnea (adult) (pediatric): Secondary | ICD-10-CM

## 2022-10-01 DIAGNOSIS — I10 Essential (primary) hypertension: Secondary | ICD-10-CM

## 2022-10-01 DIAGNOSIS — I5032 Chronic diastolic (congestive) heart failure: Secondary | ICD-10-CM

## 2022-10-01 MED ORDER — PRAVASTATIN SODIUM 20 MG PO TABS: 20.0000 mg | ORAL_TABLET | Freq: Every evening | ORAL | 3 refills | Status: AC

## 2022-10-01 MED ORDER — AZITHROMYCIN 500 MG PO TABS: 500.0000 mg | ORAL_TABLET | Freq: Once | ORAL | 0 refills | Status: AC

## 2022-10-01 NOTE — Patient Instructions (Signed)
Medication Instructions:  Your physician recommends that you continue on your current medications as directed. Please refer to the Current Medication list given to you today. Start Aspirin three days a week Increase Pravastatin to 20 mg once daily Take Amoxicillin 500 mg, 2000 mg one hour prior to dental visit *If you need a refill on your cardiac medications before your next appointment, please call your pharmacy*   Lab Work: Your physician recommends that you return for lab work in: 6 Weeks for a fasting lipid panel and Liver Panel Lab opens at 8am. You DO NOT NEED an appointment. Best time to come is between 8am and 12noon and between 1:30 and 4:30. If you have been asked to fast for your blood work please have nothing to eat or drink after midnight. You may have water.   If you have labs (blood work) drawn today and your tests are completely normal, you will receive your results only by: MyChart Message (if you have MyChart) OR A paper copy in the mail If you have any lab test that is abnormal or we need to change your treatment, we will call you to review the results.   Testing/Procedures: NONE   Follow-Up: At [facility], you and your health needs are our priority.  As part of our continuing mission to provide you with exceptional heart care, we have created designated Provider Care Teams.  These Care Teams include your primary Cardiologist (physician) and Advanced Practice Providers (APPs -  Physician Assistants and Nurse Practitioners) who all work together to provide you with the care you need, when you need it.  We recommend signing up for the patient portal called "MyChart".  Sign up information is provided on this After Visit Summary.  MyChart is used to connect with patients for Virtual Visits (Telemedicine).  Patients are able to view lab/test results, encounter notes, upcoming appointments, etc.  Non-urgent messages can be sent to your provider as well.   To learn  more about what you can do with MyChart, go to [email or web address].    Your next appointment:   6 month(s)  Provider:   Gypsy Balsam, MD    Other Instructions

## 2022-11-08 IMAGING — DX DG CHEST 1V PORT
1 series · 1 of 1 positions shown · non-contrast
Comparison: 05/31/2020

CLINICAL DATA: Postop day 0 status post aortic valve replacement
with bioprosthetic valve for aortic insufficiency.

EXAM:
PORTABLE CHEST 1 VIEW

[chest]
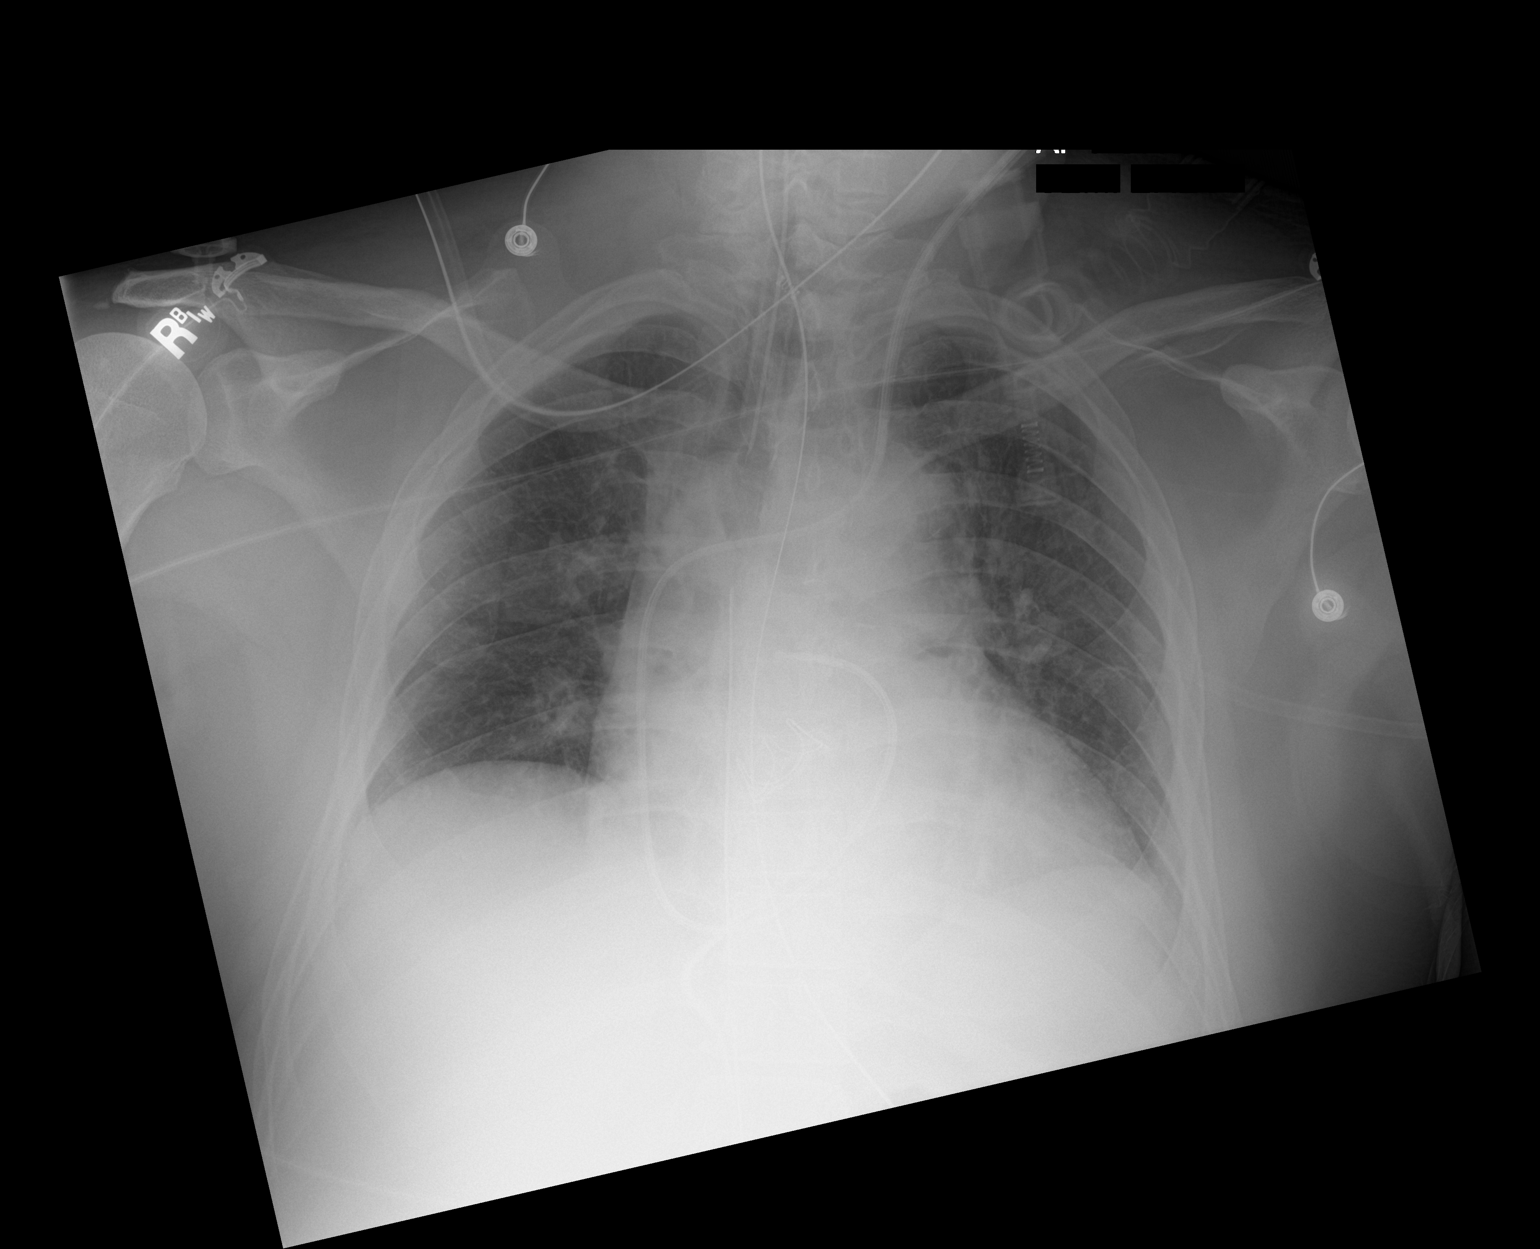

[1 of 1 positions shown; findings below may reference images not displayed]

FINDINGS: Endotracheal tube tip 3.3 cm above the carina. Left internal jugular
Swan-Ganz catheter tip is in the right pulmonary artery. Nasogastric
tube extends into the stomach and beyond the inferior margin of
today's image. Mediastinal drain projects over the cardiac shadow.
Aortic valve prosthesis noted with expected orientation.

Mildly low lung volumes. No pneumothorax. Suspected mild atelectasis
in the right upper lobe medially. Mild effacement of the AP window,
likely incidental in this postoperative setting. Mild perihilar
interstitial accentuation without well-defined Kerley B lines,
slightly greater on the left than the right, potentially related to
pulmonary venous hypertension, correlate with Raje pressures. No
blunting of the costophrenic angles. Cardiothoracic index 65%,
somewhat elevated although partially this apparent cardiac
enlargement may be attributable to the underlying low lung volumes
which can exaggerate cardiac size.
IMPRESSION: 1. Postop day 0 status post aortic valve prosthesis placement. No
pneumothorax. Suspected mild atelectasis medially in the right upper
lobe. Mild indistinctness of vasculature potentially from pulmonary
venous hypertension, correlate with Swan-Ganz catheter pressure
measurements.

## 2022-11-11 IMAGING — CR DG CHEST 2V
2 series · 2 of 2 positions shown · non-contrast
Comparison: June 06, 2020.

CLINICAL DATA: Status post aortic valve replacement. Shortness of
breath

EXAM:
CHEST - 2 VIEW

[chest pa]
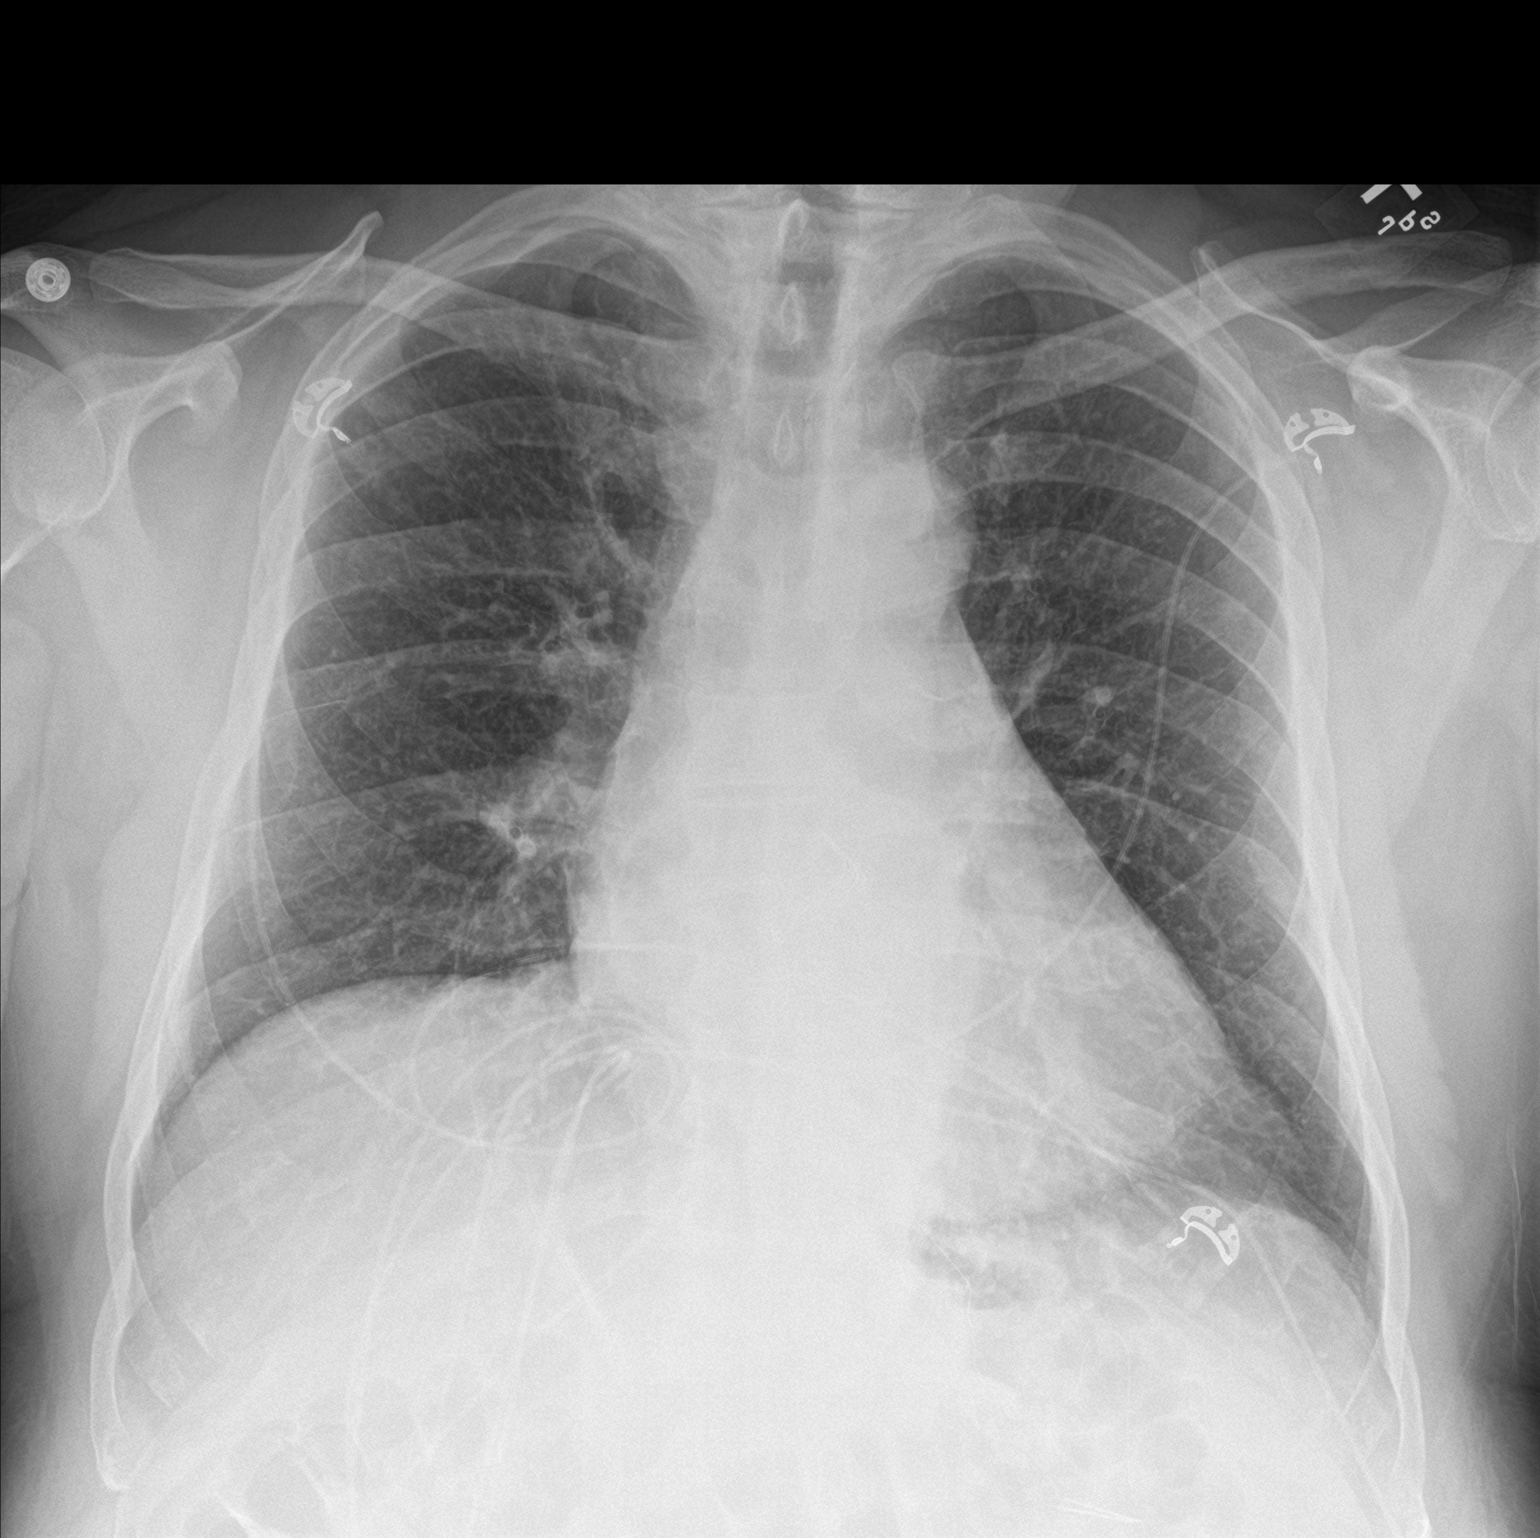

[chest lat]
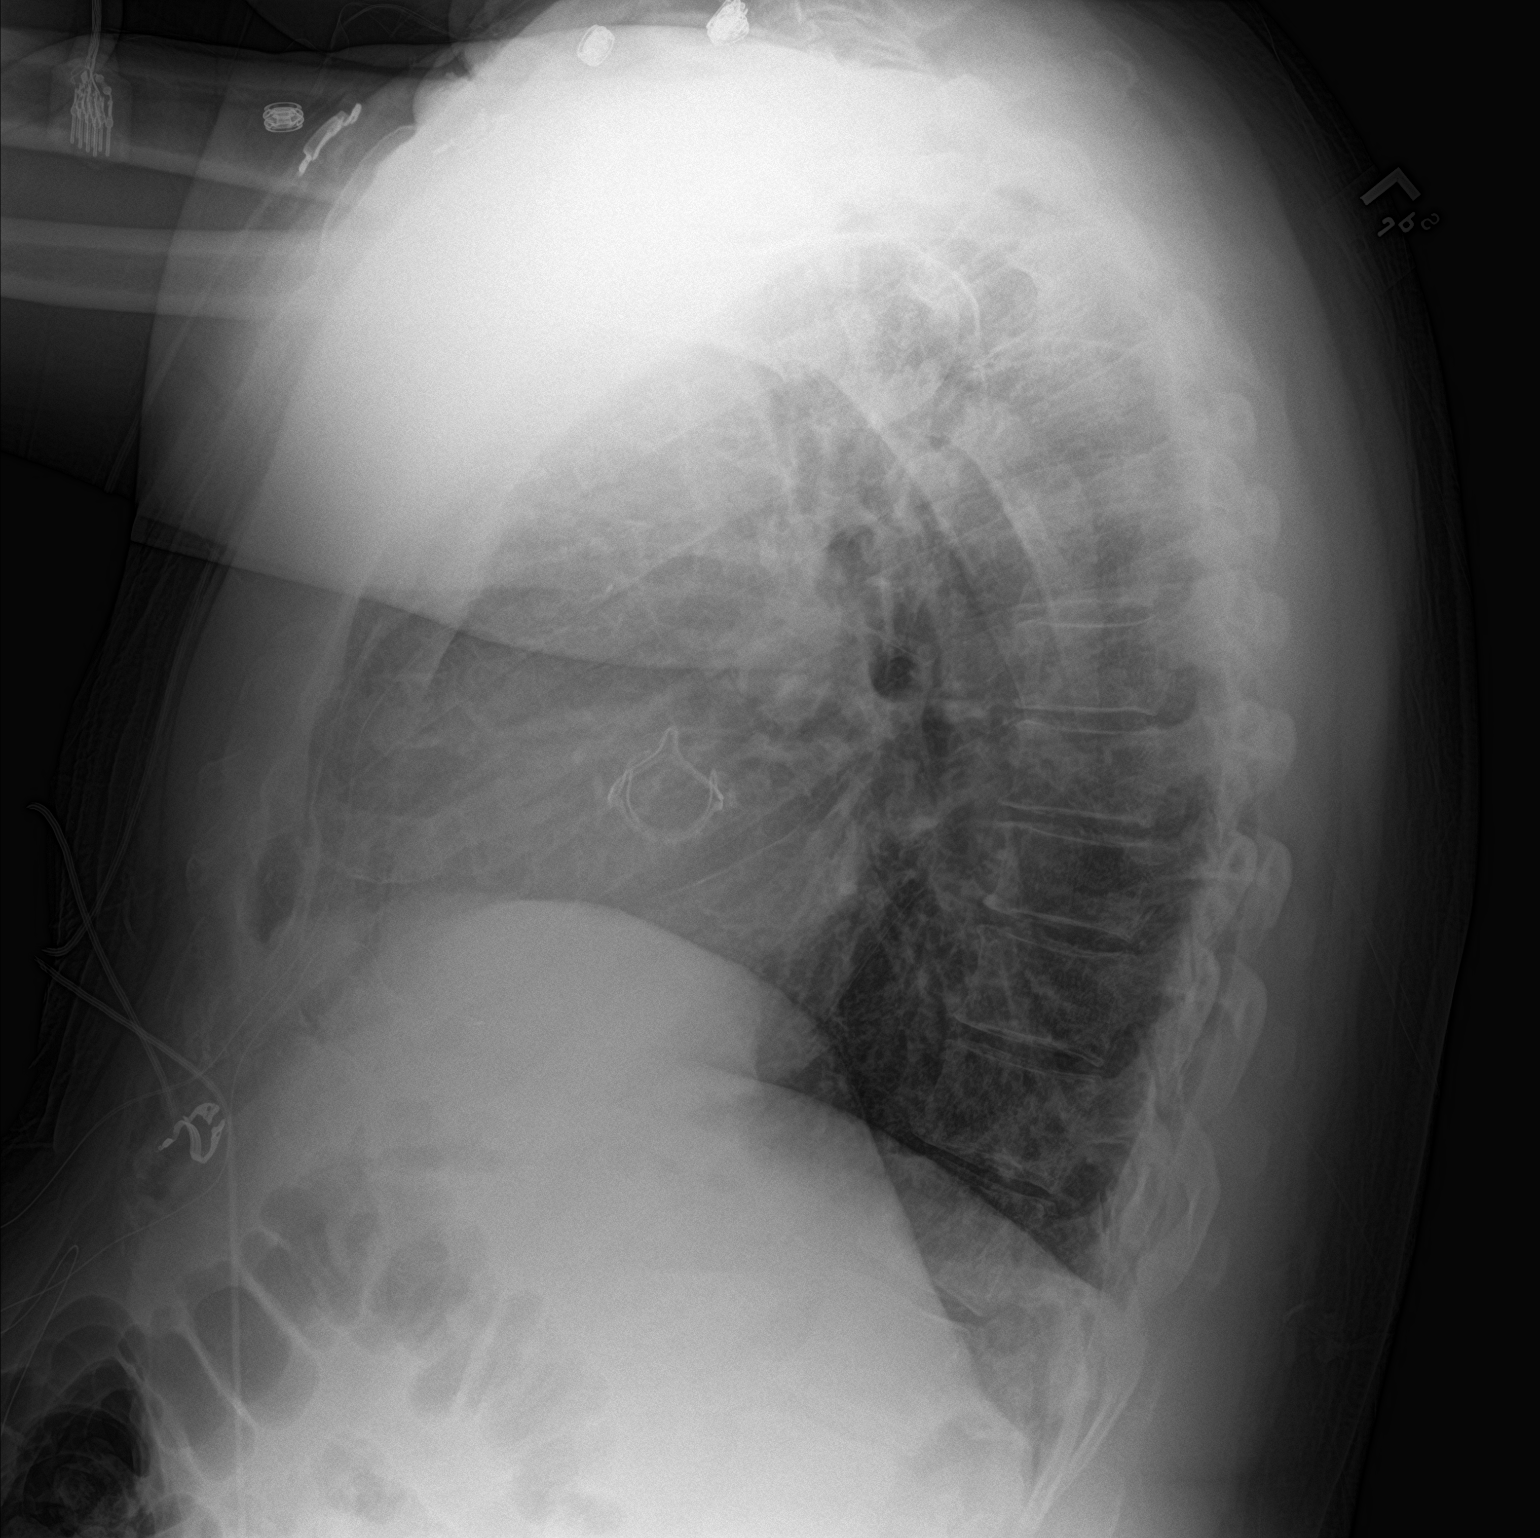

[2 of 2 positions shown; findings below may reference images not displayed]

FINDINGS: There has been removal of Cordis and mediastinal drain. Temporary
pacemaker wires are attached to the right heart. No pneumothorax.
Lungs are clear. Heart is upper normal in size with pulmonary
vascularity normal. Patient is status post aortic valve replacement.
No adenopathy. No bone lesions.
IMPRESSION: No pneumothorax. Lungs clear. Heart upper normal in size. Status
post aortic valve replacement.

## 2023-03-29 NOTE — Progress Notes (Unsigned)
 Cardiology Office Note:  .   Date:  03/30/2023  ID:  Jackson Roberts, DOB 1965-10-28, MRN [id number] PCP: Nonnie Done., MD  [facility] Providers Cardiologist:  Gypsy Balsam, MD    History of Present Illness: Jackson Roberts is a 58 y.o. male with a past medical history of hypertension, aortic valve insufficiency s/p AVR 2022, chronic diastolic heart failure, OSA on CPAP followed by Dr. Blenda Nicely, emphysema, GERD, dyslipidemia, incomplete RBBB.  03/13/2022 echo EF 60 to 65%, mild concentric LVH, aortic valve normal structure without regurgitation functioning normally 02/06/2021 monitor 1 episode of ventricular tachycardia, average heart rate 87 bpm, predominant underlying rhythm was sinus, 4 episodes of SVT, recommendations to increase his metoprolol 06/04/2020 AVR with Inspiris valve 27 mm 04/21/2020 carotid ultrasound near normal with only minimal wall thickening 03/05/2020 left heart cath mild diffuse nonobstructive CAD  Evaluated by Dr. Bing Matter on 03/27/2022, he was doing well from a cardiac perspective, he was trying to lose weight and somewhat frustrated that he had not lost more but was trying to exercise.  An echo had been recently obtained which revealed an EF of 60 to 65%, mild concentric LVH, aortic valve was normal in structure without regurgitation and no stenosis and was functioning normally.  Evaluated 10/01/22  for follow up of his AVR and HTN.  He had been struggling with weight loss, been able to lose about 10 pounds however cannot make the scale but much more than that, referred to [facility].    He presents today accompanied by his wife with concerns of increased fatigue, also episodes of excessive daytime sleepiness--the symptoms are concerning to him as he states these are most prevalent prior to his AVR, states this has been going on for approximately 1 month.  He is already seen his pulmonologist, no changes were made.  He continues to  exercise 3 times a week on his elliptical machine.  No significant changes with his weight.  His wife is also concerned about him breathing loudly, this is apparently not a change however she is just recently noticed it.  Previously referred him to [facility] although he is not sure if he ever connected with them or not. He denies chest pain, palpitations, dyspnea, pnd, orthopnea, n, v, dizziness, syncope, edema, weight gain, or early satiety.   ROS: Review of Systems  Constitutional:  Positive for malaise/fatigue.  All other systems reviewed and are negative.    Studies Reviewed: Marland Kitchen   EKG Interpretation Date/Time:  Tuesday March 30 2023 07:54:30 EST Ventricular Rate:  60 PR Interval:  180 QRS Duration:  92 QT Interval:  426 QTC Calculation: 426 R Axis:   13  Text Interpretation: Normal sinus rhythm Incomplete right bundle branch block When compared with ECG of 05-Jun-2020 06:44, Vent. rate has decreased BY  41 BPM Criteria for Inferior infarct are no longer Present ST no longer elevated in Inferior leads QT has shortened Confirmed by Wallis Bamberg [id number] on 03/30/2023 7:56:07 AM   Cardiac Studies & Procedures   ______________________________________________________________________________________________ CARDIAC CATHETERIZATION  CARDIAC CATHETERIZATION 03/15/2020  Narrative  There is no aortic valve stenosis. There is severe (4+) aortic regurgitation.  1.  Patent coronary arteries with mild diffuse nonobstructive CAD as outlined, no significant stenoses are present 2.  Severe aortic valve insufficiency based on aortic root angiography 3.  Essentially normal right heart hemodynamics  Findings Coronary Findings Diagnostic  Dominance: Right  Left Main Widely patent vessel without stenosis.  Divides into the LAD and circumflex.  Left Anterior Descending Vessel was injected. Vessel is large. The vessel exhibits minimal luminal irregularities. Patent vessel to  the apex of the heart, patent first diagonal branch with no stenosis, mild irregularities throughout.  Left Circumflex The circumflex is a very large vessel supplying a large obtuse marginal that divides into multiple vessels and supplies much of the lateral wall.  There is no stenosis throughout the circumflex distribution.  Right Coronary Artery Vessel is large. There is mild diffuse disease throughout the vessel. Dominant vessel, relatively smooth with areas of mild tapering in the mid vessel, no more than 30% stenosis.  Intervention  No interventions have been documented.     ECHOCARDIOGRAM  ECHOCARDIOGRAM COMPLETE 03/13/2022  Narrative ECHOCARDIOGRAM REPORT    Patient Name:   Jackson Roberts Date of Exam: 03/13/2022 Medical Rec #:  [id number]           Height:       70.0 in Accession #:    [id number]          Weight:       282.2 lb Date of Birth:  1965/05/28          BSA:          2.416 m Patient Age:    56 years            BP:           122/88 mmHg Patient Gender: M                   HR:           68 bpm. Exam Location:  [place]  Procedure: 2D Echo, Cardiac Doppler, Color Doppler and Strain Analysis  Indications:    S/P aortic valve replacement with bioprosthetic valve [Z95.3 (ICD-10-CM)]  History:        Patient has prior history of Echocardiogram examinations, most recent 02/21/2021. CHF, Aortic Valve Disease; Risk Factors:Dyslipidemia and Hypertension. Aortic Valve: 27 mm Inspiris Inspiris Valve size 27mm; Surgeon: Purcell Nails, MD; valve is present in the aortic position.  Sonographer:    Margreta Journey RDCS Referring Phys: [id number] Georgeanna Lea   Sonographer Comments: Suboptimal subcostal window. IMPRESSIONS   1. Left ventricular ejection fraction, by estimation, is 60 to 65%. The left ventricle has normal function. The left ventricle has no regional wall motion abnormalities. There is mild concentric left ventricular hypertrophy. Left  ventricular diastolic parameters were normal. The average left ventricular global longitudinal strain is -18.6 %. The global longitudinal strain is normal. 2. Right ventricular systolic function is normal. The right ventricular size is normal. 3. The mitral valve is normal in structure. No evidence of mitral valve regurgitation. No evidence of mitral stenosis. 4. The aortic valve is normal in structure. Aortic valve regurgitation is not visualized. No aortic stenosis is present. There is a 27 mm Inspiris Inspiris Valve size 27mm; Surgeon: Purcell Nails, MD; valve present in the aortic position. Echo findings are consistent with normal structure and function of the aortic valve prosthesis. 5. There is borderline dilatation of the ascending aorta, measuring 38 mm.  FINDINGS Left Ventricle: Left ventricular ejection fraction, by estimation, is 60 to 65%. The left ventricle has normal function. The left ventricle has no regional wall motion abnormalities. The average left ventricular global longitudinal strain is -18.6 %. The global longitudinal strain is normal. The left ventricular internal cavity size was normal in size. There is mild concentric left ventricular hypertrophy.  Left ventricular diastolic parameters were normal. Normal left ventricular filling pressure.  Right Ventricle: The right ventricular size is normal. No increase in right ventricular wall thickness. Right ventricular systolic function is normal.  Left Atrium: Left atrial size was normal in size.  Right Atrium: Right atrial size was normal in size.  Pericardium: There is no evidence of pericardial effusion.  Mitral Valve: The mitral valve is normal in structure. No evidence of mitral valve regurgitation. No evidence of mitral valve stenosis.  Tricuspid Valve: The tricuspid valve is normal in structure. Tricuspid valve regurgitation is not demonstrated. No evidence of tricuspid stenosis.  Aortic Valve: The aortic valve is  normal in structure. Aortic valve regurgitation is not visualized. No aortic stenosis is present. Aortic valve mean gradient measures 8.3 mmHg. Aortic valve peak gradient measures 13.3 mmHg. Aortic valve area, by VTI measures 2.05 cm. There is a 27 mm Inspiris Inspiris Valve size 27mm; Surgeon: Purcell Nails, MD; valve present in the aortic position. Echo findings are consistent with normal structure and function of the aortic valve prosthesis.  Pulmonic Valve: The pulmonic valve was normal in structure. Pulmonic valve regurgitation is not visualized. No evidence of pulmonic stenosis.  Aorta: The aortic root and ascending aorta are structurally normal, with no evidence of dilitation and the aortic arch was not well visualized. There is borderline dilatation of the ascending aorta, measuring 38 mm.  Venous: The pulmonary veins were not well visualized. The inferior vena cava was not well visualized.  IAS/Shunts: No atrial level shunt detected by color flow Doppler.   LEFT VENTRICLE PLAX 2D LVIDd:         4.80 cm   Diastology LVIDs:         3.40 cm   LV e' medial:    7.62 cm/s LV PW:         1.10 cm   LV E/e' medial:  8.1 LV IVS:        1.20 cm   LV e' lateral:   7.62 cm/s LVOT diam:     2.70 cm   LV E/e' lateral: 8.1 LV SV:         81 LV SV Index:   33        2D Longitudinal Strain LVOT Area:     5.73 cm  2D Strain GLS Avg:     -18.6 %   RIGHT VENTRICLE RV S prime:     9.25 cm/s TAPSE (M-mode): 1.9 cm  LEFT ATRIUM           Index        RIGHT ATRIUM           Index LA diam:      3.60 cm 1.49 cm/m   RA Area:     13.30 cm LA Vol (A2C): 36.3 ml 15.02 ml/m  RA Volume:   28.20 ml  11.67 ml/m LA Vol (A4C): 27.9 ml 11.55 ml/m AORTIC VALVE                     PULMONIC VALVE AV Area (Vmax):    2.05 cm      PR End Diast Vel: 3.72 msec AV Area (Vmean):   2.01 cm AV Area (VTI):     2.05 cm AV Vmax:           182.33 cm/s AV Vmean:          138.667 cm/s AV VTI:  0.393  m AV Peak Grad:      13.3 mmHg AV Mean Grad:      8.3 mmHg LVOT Vmax:         65.30 cm/s LVOT Vmean:        48.600 cm/s LVOT VTI:          0.141 m LVOT/AV VTI ratio: 0.36  AORTA Ao Root diam: 3.90 cm Ao Asc diam:  3.80 cm Ao Desc diam: 2.30 cm  MITRAL VALVE MV Area (PHT): 4.19 cm    SHUNTS MV Decel Time: 181 msec    Systemic VTI:  0.14 m MV E velocity: 62.10 cm/s  Systemic Diam: 2.70 cm MV A velocity: 49.70 cm/s MV E/A ratio:  1.25  Norman Herrlich MD Electronically signed by Norman Herrlich MD Signature Date/Time: 03/13/2022/12:20:44 PM    Final   TEE  ECHO INTRAOPERATIVE TEE 06/04/2020  Narrative *INTRAOPERATIVE TRANSESOPHAGEAL REPORT *    Patient Name:   Jackson Roberts Date of Exam: 06/04/2020 Medical Rec #:  [id number]           Height:       70.0 in Accession #:    [id number]          Weight:       271.6 lb Date of Birth:  Dec 28, 1965          BSA:          2.38 m Patient Age:    54 years            BP:           160/60 mmHg Patient Gender: M                   HR:           79 bpm. Exam Location:  Inpatient  Transesophogeal exam was perform intraoperatively during surgical procedure. Patient was closely monitored under general anesthesia during the entirety of examination.  Indications:     aortic valve replacement Performing Phys: [id number] CLARENCE H OWEN Diagnosing Phys: Gaynelle Adu MD  Complications: No known complications during this procedure. POST-OP IMPRESSIONS - Left Ventricle: The left ventricle is unchanged from pre-bypass. - Right Ventricle: The right ventricle appears unchanged from pre-bypass. - Aortic Valve: A bovine bioprosthetic valve was placed, leaflets are freely mobile and leaflets thin Size; 27mm. No regurgitation post repair. The gradient recorded across the prosthetic valve is within the expected range. No perivalvular leak noted. - Mitral Valve: The mitral valve appears unchanged from pre-bypass. - Tricuspid Valve: The tricuspid  valve appears unchanged from pre-bypass. - Pulmonic Valve: The pulmonic valve appears unchanged from pre-bypass.  PRE-OP FINDINGS Left Ventricle: The left ventricle has normal systolic function, with an ejection fraction of 55-60%. The cavity size was normal. There is no increase in left ventricular wall thickness. No evidence of left ventricular regional wall motion abnormalities.   Right Ventricle: The right ventricle has normal systolic function. The cavity was normal. There is no increase in right ventricular wall thickness.  Left Atrium: Left atrial size was not assessed. No left atrial/left atrial appendage thrombus was detected.  Right Atrium: Right atrial size was not assessed.  Interatrial Septum: No atrial level shunt detected by color flow Doppler.  Pericardium: There is no evidence of pericardial effusion.  Mitral Valve: The mitral valve is normal in structure. Mitral valve regurgitation is trivial by color flow Doppler.  Tricuspid Valve: The tricuspid valve was normal in structure. Tricuspid valve regurgitation was not  visualized by color flow Doppler.  Aortic Valve: The aortic valve is tricuspid Aortic valve regurgitation is severe by color flow Doppler. The jet is eccentric anteriorly directed. There is no stenosis of the aortic valve.   Pulmonic Valve: The pulmonic valve was normal in structure. Pulmonic valve regurgitation is not visualized by color flow Doppler.   +--------------+--------++ LEFT VENTRICLE         +--------------+--------++ PLAX 2D                +--------------+--------++ LVIDd:        5.60 cm  +--------------+--------++ LVIDs:        3.80 cm  +--------------+--------++ LVOT diam:    3.30 cm  +--------------+--------++ LV SV:        92 ml    +--------------+--------++ LV SV Index:  36.41    +--------------+--------++ LVOT Area:    8.55 cm +--------------+--------++                         +--------------+--------++  +-------------+------------++ AORTIC VALVE              +-------------+------------++ AV Vmax:     163.00 cm/s  +-------------+------------++ AV Vmean:    125.000 cm/s +-------------+------------++ AV VTI:      0.306 m      +-------------+------------++ AV Peak Grad:10.6 mmHg    +-------------+------------++ AV Mean Grad:7.0 mmHg     +-------------+------------++ AR PHT:      248 msec     +-------------+------------++   +--------------+-------+ SHUNTS                +--------------+-------+ Systemic Diam:3.30 cm +--------------+-------+   Gaynelle Adu MD Electronically signed by Gaynelle Adu MD Signature Date/Time: 06/04/2020/2:20:53 PM    Final  MONITORS  LONG TERM MONITOR (3-14 DAYS) 02/26/2021  Narrative Patch Wear Time:  13 days and 20 hours (2023-01-05T09:24:33-0500 to 2023-01-19T05:31:33-0500)  Patient had a min HR of 57 bpm, max HR of 203 bpm, and avg HR of 87 bpm. Predominant underlying rhythm was Sinus Rhythm. 1 run of Ventricular Tachycardia occurred lasting 6 beats with a max rate of 203 bpm (avg 165 bpm). 4 Supraventricular Tachycardia runs occurred, the run with the fastest interval lasting 4 beats with a max rate of 184 bpm, the longest lasting 4 beats with an avg rate of 156 bpm. Isolated SVEs were rare (<1.0%), SVE Couplets were rare (<1.0%), and SVE Triplets were rare (<1.0%). Isolated VEs were rare (<1.0%), VE Couplets were rare (<1.0%), and no VE Triplets were present. Ventricular Bigeminy and Trigeminy were present.  Summary conclusions: 1 episode of ventricular tachycardia total 6 beats at rate of 203. 4 episode of supraventricular tachycardia only 4 beats. Ventricular bigeminy and trigeminy noted Triggered event showing sinus rhythm   CT SCANS  CT CORONARY MORPH W/CTA COR W/SCORE 05/01/2020  Addendum 05/01/2020 10:10 PM ADDENDUM REPORT: 05/01/2020 22:08  CLINICAL  DATA:  Severe AI being evaluated for AVR  EXAM: Cardiac TAVR CT  TECHNIQUE: The patient was scanned on a [employer]. A 120 kV retrospective scan was triggered in the descending thoracic aorta at 111 HU's. Gantry rotation speed was 250 msecs and collimation was .6 mm. No beta blockade or nitro were given. The 3D data set was reconstructed in 5% intervals of the R-R cycle. Systolic and diastolic phases were analyzed on a dedicated work station using MPR, MIP and VRT modes. The patient received 80 cc of contrast.  FINDINGS: Aortic Root:  Aortic  valve: Trileaflet  Aortic valve calcium score: 0  Aortic annulus:  Diameter: 41mm x 35mm  Perimeter:  Area: 1060 mm^2  Calcifications: No calcifications  Coronary height: Min Left - 20mm, Max Left - 24mm; Min Right - 22mm  Sinotubular height: Left cusp - 25mm; Right cusp - 27mm; Noncoronary cusp - 25mm  LVOT (as measured 3 mm below the annulus):  Diameter: 45mm x 34mm  Area: 1110 mm^2  Calcifications: No calcifications  Aortic sinus width: Left cusp - 43mm; Right cusp - 40mm; Noncoronary cusp - 41mm  Sinotubular junction width: 34mm x 33mm  Cardiac:  Right atrium: Mild enlargement  Right ventricle: Normal size  Pulmonary arteries: Normal size  Pulmonary veins: Normal configuration  Left atrium: Mild enlargement  Left ventricle: Severe dilatation  Pericardium: Normal  Coronary arteries: Calcium score 138 (82nd percentile)  IMPRESSION: 1.  Trileaflet aortic valve, no calcifications  2. Aortic annulus measures 41mm x 35mm in diameter with perimeter and area 1060 mm^2. No annular calcifications  3.  Severe LV dilatation  4.  Coronary calcium score 138 (82nd percentile)   Electronically Signed By: Epifanio Lesches MD On: 05/01/2020 22:08  Narrative EXAM: OVER-READ INTERPRETATION  CT CHEST  The following report is an over-read performed by radiologist Dr. Leanna Battles  of [facility] on 05/01/2020. This over-read does not include interpretation of cardiac or coronary anatomy or pathology. The coronary calcium score/coronary CTA interpretation by the cardiologist is attached.  COMPARISON:  None.  FINDINGS: Vascular: None.  Mediastinum/Nodes: No pathologically enlarged mediastinal, hilar or axillary lymph nodes. Esophagus is grossly unremarkable. Subcentimeter right thyroid nodule. No follow-up recommended (ref: J Am Coll Radiol. 2015 Feb;12(2): 143-50).  Lungs/Pleura: Mild centrilobular emphysema. 6 mm nodule along the minor fissure (5 x 7 mm, 11/39). Visualized lungs are otherwise clear. No pleural fluid. Minimal debris in the airway.  Upper Abdomen: Liver is decreased in attenuation. Visualized portions of the liver, spleen and stomach are otherwise unremarkable.  Musculoskeletal: None.  IMPRESSION: 1. 6 mm nodule along the minor fissure may represent a subpleural lymph node. Non-contrast chest CT at 6-12 months is recommended. If the nodule is stable at time of repeat CT, then future CT at 18-24 months (from today's scan) is considered optional for low-risk patients, but is recommended for high-risk patients. This recommendation follows the consensus statement: Guidelines for Management of Incidental Pulmonary Nodules Detected on CT Images: From the Fleischner Society 2017; Radiology 2017; 284:228-243. 2. Hepatic steatosis. 3.  Emphysema (ICD10-J43.9).  Electronically Signed: By: Leanna Battles M.D. On: 05/01/2020 11:02     ______________________________________________________________________________________________      Risk Assessment/Calculations:             Physical Exam:   VS:  BP 110/80 (BP Location: Left Arm, Patient Position: Sitting, Cuff Size: Large)   Pulse 60   Ht 5\' 10"  (1.778 m)   Wt 278 lb (126.1 kg)   SpO2 95%   BMI 39.89 kg/m    Wt Readings from Last 3 Encounters:  03/30/23 278 lb (126.1  kg)  10/01/22 278 lb 12.8 oz (126.5 kg)  03/27/22 290 lb (131.5 kg)    GEN: Well nourished, well developed in no acute distress NECK: No JVD; No carotid bruits CARDIAC: RRR, no murmurs, rubs, gallops RESPIRATORY:  Clear to auscultation without rales, wheezing or rhonchi  ABDOMEN: Soft, non-tender, non-distended EXTREMITIES:  No edema; No deformity   ASSESSMENT AND PLAN: .   S/p AVR-recent echocardiogram in February revealed device  functioning appropriately.  Will repeat echocardiogram.  He has antibiotics for SBE.  Fatigue/excessive daytime sleepiness - compliant with CPAP, saw pulmonology recently and his device is functioning appropriately. Will check CBC to rule out anemia, will check TSH to rule out thyroid dysfunction. Will check BMET for any electrolyte abnormalities.   OSA on CPAP - follows with Dr. Blenda Nicely, saw recently, device functioning appropriately.   Dyslipidemia-he is fasting today, will repeat LFTs and FLP. Currently on Pravachol 20 mg daily. Prefer LDL to be less than 70.  CAD-nonobstructive per left heart cath in 2022, Stable with no anginal symptoms. No indication for ischemic evaluation.  Increasing his statin per above, he has had difficulty tolerating aspirin secondary to nosebleeds, will take aspirin 81 mg daily 3 days a week, continue metoprolol 75 mg daily.  Hypertension-blood pressure well-controlled at 110/80, continue candesartan 8 mg daily. Will have him keep a BP log x 2 weeks to make sure his BP is not too low at home which could contribute to his fatigue.   Obesity - BMI is 40, he is trying to augment his diet and increase physical activity. Reginal Lutes is not covered by his insurance plan.  Previously referred him to [facility], he states he cannot recall if he met with them or not.              Dispo: Echocardiogram, labs for above, blood pressure log for 2 weeks, follow-up in 6 weeks with Dr. Bing Matter.  Signed, Flossie Dibble, NP

## 2023-03-30 ENCOUNTER — Ambulatory Visit: Payer: BC Managed Care – PPO | Attending: Cardiology | Admitting: Cardiology

## 2023-03-30 ENCOUNTER — Encounter: Payer: Self-pay | Admitting: Cardiology

## 2023-03-30 VITALS — BP 110/80 | HR 60 | Ht 70.0 in | Wt 278.0 lb

## 2023-03-30 DIAGNOSIS — I5032 Chronic diastolic (congestive) heart failure: Secondary | ICD-10-CM | POA: Diagnosis not present

## 2023-03-30 DIAGNOSIS — Z953 Presence of xenogenic heart valve: Secondary | ICD-10-CM

## 2023-03-30 DIAGNOSIS — I10 Essential (primary) hypertension: Secondary | ICD-10-CM | POA: Diagnosis not present

## 2023-03-30 DIAGNOSIS — E785 Hyperlipidemia, unspecified: Secondary | ICD-10-CM

## 2023-03-30 DIAGNOSIS — R5383 Other fatigue: Secondary | ICD-10-CM

## 2023-03-30 DIAGNOSIS — G4733 Obstructive sleep apnea (adult) (pediatric): Secondary | ICD-10-CM

## 2023-03-30 NOTE — Patient Instructions (Addendum)
 Medication Instructions:  Your physician recommends that you continue on your current medications as directed. Please refer to the Current Medication list given to you today.  *If you need a refill on your cardiac medications before your next appointment, please call your pharmacy*   Lab Work: CBC, CMP, Lipid, TSH- today If you have labs (blood work) drawn today and your tests are completely normal, you will receive your results only by: MyChart Message (if you have MyChart) OR A paper copy in the mail If you have any lab test that is abnormal or we need to change your treatment, we will call you to review the results.   Testing/Procedures: Your physician has requested that you have an echocardiogram. Echocardiography is a painless test that uses sound waves to create images of your heart. It provides your doctor with information about the size and shape of your heart and how well your heart's chambers and valves are working. This procedure takes approximately one hour. There are no restrictions for this procedure. Please do NOT wear cologne, perfume, aftershave, or lotions (deodorant is allowed). Please arrive 15 minutes prior to your appointment time.  Please note: We ask at that you not bring children with you during ultrasound (echo/ vascular) testing. Due to room size and safety concerns, children are not allowed in the ultrasound rooms during exams. Our front office staff cannot provide observation of children in our lobby area while testing is being conducted. An adult accompanying a patient to their appointment will only be allowed in the ultrasound room at the discretion of the ultrasound technician under special circumstances. We apologize for any inconvenience.    Follow-Up: At [facility], you and your health needs are our priority.  As part of our continuing mission to provide you with exceptional heart care, we have created designated Provider Care Teams.  These Care Teams  include your primary Cardiologist (physician) and Advanced Practice Providers (APPs -  Physician Assistants and Nurse Practitioners) who all work together to provide you with the care you need, when you need it.  We recommend signing up for the patient portal called "MyChart".  Sign up information is provided on this After Visit Summary.  MyChart is used to connect with patients for Virtual Visits (Telemedicine).  Patients are able to view lab/test results, encounter notes, upcoming appointments, etc.  Non-urgent messages can be sent to your provider as well.   To learn more about what you can do with MyChart, go to [email or web address].    Your next appointment:   6 week(s) with Dr. Bing Matter  The format for your next appointment:   In Person  Provider:   Wallis Bamberg, NP   Other Instructions NA             Please keep a BP log for 2 weeks and send by MyChart or mail.                         Name and DOB__________________________ Wallis Bamberg, NP/[facility] [address] [place], [place] [address]  Blood Pressure Record Sheet To take your blood pressure, you will need a blood pressure machine. You can buy a blood pressure machine (blood pressure monitor) at your clinic, drug store, or online. When choosing one, consider: An automatic monitor that has an arm cuff. A cuff that wraps snugly around your upper arm. You should be able to fit only one finger between your arm and the cuff. A device that  stores blood pressure reading results. Do not choose a monitor that measures your blood pressure from your wrist or finger. Follow your health care provider's instructions for how to take your blood pressure. To use this form: Get one reading in the morning (a.m.) 1-2 hours after you take any medicines. Get one reading in the evening (p.m.) before supper.   Blood pressure log Date: _______________________  a.m. _____________________(1st reading) HR___________             p.m. _____________________(2nd reading) HR__________  Date: _______________________  a.m. _____________________(1st reading) HR___________            p.m. _____________________(2nd reading) HR__________  Date: _______________________  a.m. _____________________(1st reading) HR___________            p.m. _____________________(2nd reading) HR__________  Date: _______________________  a.m. _____________________(1st reading) HR___________            p.m. _____________________(2nd reading) HR__________  Date: _______________________  a.m. _____________________(1st reading) HR___________            p.m. _____________________(2nd reading) HR__________  Date: _______________________  a.m. _____________________(1st reading) HR___________            p.m. _____________________(2nd reading) HR__________  Date: _______________________  a.m. _____________________(1st reading) HR___________            p.m. _____________________(2nd reading) HR__________   This information is not intended to replace advice given to you by your health care provider. Make sure you discuss any questions you have with your health care provider. Document Revised: 05/10/2019 Document Reviewed: 05/10/2019 Elsevier Patient Education  2021 [employer].

## 2023-03-31 ENCOUNTER — Ambulatory Visit: Payer: BC Managed Care – PPO | Attending: Cardiology

## 2023-03-31 DIAGNOSIS — Z953 Presence of xenogenic heart valve: Secondary | ICD-10-CM

## 2023-03-31 LAB — COMPREHENSIVE METABOLIC PANEL WITH GFR
ALT: 29 IU/L (ref 0–44)
AST: 21 IU/L (ref 0–40)
Albumin: 4.6 g/dL (ref 3.8–4.9)
Alkaline Phosphatase: 70 IU/L (ref 44–121)
BUN/Creatinine Ratio: 13 (ref 9–20)
BUN: 11 mg/dL (ref 6–24)
Bilirubin Total: 0.6 mg/dL (ref 0.0–1.2)
CO2: 22 mmol/L (ref 20–29)
Calcium: 9.6 mg/dL (ref 8.7–10.2)
Chloride: 103 mmol/L (ref 96–106)
Creatinine, Ser: 0.87 mg/dL (ref 0.76–1.27)
Globulin, Total: 2.3 g/dL (ref 1.5–4.5)
Glucose: 94 mg/dL (ref 70–99)
Potassium: 4.2 mmol/L (ref 3.5–5.2)
Sodium: 140 mmol/L (ref 134–144)
Total Protein: 6.9 g/dL (ref 6.0–8.5)
eGFR: 99 mL/min/1.73

## 2023-03-31 LAB — LIPID PANEL
Chol/HDL Ratio: 3.7 ratio (ref 0.0–5.0)
Cholesterol, Total: 159 mg/dL (ref 100–199)
HDL: 43 mg/dL
LDL Chol Calc (NIH): 83 mg/dL (ref 0–99)
Triglycerides: 195 mg/dL — ABNORMAL HIGH (ref 0–149)
VLDL Cholesterol Cal: 33 mg/dL (ref 5–40)

## 2023-03-31 LAB — CBC
Hematocrit: 53.2 % — ABNORMAL HIGH (ref 37.5–51.0)
Hemoglobin: 17.6 g/dL (ref 13.0–17.7)
MCH: 30.7 pg (ref 26.6–33.0)
MCHC: 33.1 g/dL (ref 31.5–35.7)
MCV: 93 fL (ref 79–97)
Platelets: 209 x10E3/uL (ref 150–450)
RBC: 5.74 x10E6/uL (ref 4.14–5.80)
RDW: 13.8 % (ref 11.6–15.4)
WBC: 7.7 x10E3/uL (ref 3.4–10.8)

## 2023-03-31 LAB — ECHOCARDIOGRAM COMPLETE
AR max vel: 1.17 cm2
AV Area VTI: 1.34 cm2
AV Area mean vel: 1.18 cm2
AV Mean grad: 17 mmHg
AV Peak grad: 32 mmHg
Ao pk vel: 2.83 m/s
S' Lateral: 2.8 cm

## 2023-03-31 LAB — TSH: TSH: 4.78 u[IU]/mL — ABNORMAL HIGH (ref 0.450–4.500)

## 2023-05-25 ENCOUNTER — Encounter: Payer: Self-pay | Admitting: Cardiology

## 2023-05-25 ENCOUNTER — Ambulatory Visit: Payer: BC Managed Care – PPO | Attending: Cardiology | Admitting: Cardiology

## 2023-05-25 VITALS — BP 108/78 | HR 63 | Ht 70.0 in | Wt 281.2 lb

## 2023-05-25 DIAGNOSIS — I10 Essential (primary) hypertension: Secondary | ICD-10-CM

## 2023-05-25 DIAGNOSIS — Z952 Presence of prosthetic heart valve: Secondary | ICD-10-CM | POA: Diagnosis not present

## 2023-05-25 DIAGNOSIS — E785 Hyperlipidemia, unspecified: Secondary | ICD-10-CM

## 2023-05-25 DIAGNOSIS — I5032 Chronic diastolic (congestive) heart failure: Secondary | ICD-10-CM | POA: Diagnosis not present

## 2023-05-25 DIAGNOSIS — R5383 Other fatigue: Secondary | ICD-10-CM

## 2023-05-25 MED ORDER — METOPROLOL SUCCINATE ER 50 MG PO TB24: 50.0000 mg | ORAL_TABLET | Freq: Every day | ORAL | 3 refills | Status: AC

## 2023-05-25 NOTE — Progress Notes (Signed)
 Cardiology Office Note:    Date:  05/25/2023   ID:  Jackson Roberts, DOB 1965-12-14, MRN [id number]  PCP:  Lucius Sabins., MD  Cardiologist:  Ralene Burger, MD    Referring MD: Lucius Sabins., MD   Chief Complaint  Patient presents with   Fatigue    Feel so tired he sleep fast. With exertion     History of Present Illness:    Jackson Roberts is a 58 y.o. 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  Also emphysema, GERD, dyslipidemia, incomplete right bundle branch block obstructive sleep apnea chronic diastolic congestive heart failure.  He has been complaining of being weak tired and exhausted for the last few months.  He is very sleepy and also during the day he described the fact that he washed his deck it took him 1 and half hour to do it after that he had to go to sleep he was so exhausted.  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   Dizziness 01/17/2020   Dyslipidemia    Emphysema of lung (HCC)    Essential hypertension    Fatty liver    GERD (gastroesophageal reflux disease)    Heart murmur    due to arotic insufficiency   Incidental pulmonary nodule, > 3mm and < 8mm 05/01/2020   Right middle lobe - needs f/u CT   Mixed hyperlipidemia    Nonrheumatic aortic valve insufficiency    Plantar fasciitis of right foot 07/17/2015   S/P aortic valve replacement with bioprosthetic valve 06/04/2020   27 mm [employer] Inspiris Resilia Bioprosthetic Valve  SN: [id number]   S/P AVR (aortic valve replacement) 06/04/2020   Sleep apnea    wears CPAP    Past Surgical History:  Procedure Laterality Date   AORTIC VALVE REPLACEMENT N/A 06/04/2020   Procedure: AORTIC VALVE REPLACEMENT (AVR)  using Inspiris Valve size 27mm;  Surgeon: Gardenia Jump, MD;   Location[facility] OR;  Service: Open Heart Surgery;  Laterality: N/A;   RIGHT/LEFT HEART CATH AND CORONARY ANGIOGRAPHY N/A 03/15/2020   Procedure: RIGHT/LEFT HEART CATH AND CORONARY ANGIOGRAPHY;  Surgeon: Arnoldo Lapping, MD;  Location: [facility] INVASIVE CV LAB;  Service: Cardiovascular;  Laterality: N/A;   TEE WITHOUT CARDIOVERSION N/A 05/08/2020   Procedure: TRANSESOPHAGEAL ECHOCARDIOGRAM (TEE);  Surgeon: Luana Rumple, MD;  Location: [facility] ENDOSCOPY;  Service: Cardiovascular;  Laterality: N/A;   TEE WITHOUT CARDIOVERSION N/A 06/04/2020   Procedure: TRANSESOPHAGEAL ECHOCARDIOGRAM (TEE);  Surgeon: Gardenia Jump, MD;  Location: [facility] OR;  Service: Open Heart Surgery;  Laterality: N/A;    Current Medications: Current Meds  Medication Sig   acetaminophen  (TYLENOL ) 500 MG tablet Take 1-2 tablets (500-1,000 mg total) by mouth every 6 (six) hours as needed (pain).   albuterol  (VENTOLIN  HFA) 108 (90 Base) MCG/ACT inhaler Inhale 2 puffs into the lungs every 6 (six) hours as needed for wheezing or shortness of breath.   Ascorbic Acid (VITAMIN C) 1000 MG tablet Take 1,000 mg by mouth in the morning.   aspirin  325 MG EC tablet Take 1 tablet (325 mg total) by mouth daily.   candesartan  (ATACAND ) 8 MG tablet Take 1 tablet (8 mg total) by mouth in the morning.   cetirizine-pseudoephedrine (ZYRTEC-D) 5-120 MG tablet Take 1 tablet by mouth 2 (two) times daily as  needed for allergies.   Cholecalciferol (VITAMIN D3) 5000 units TABS Take 5,000 Units by mouth in the morning.   dextromethorphan-guaiFENesin (MUCINEX DM) 30-600 MG 12hr tablet Take 1 tablet by mouth 2 (two) times daily as needed for cough (congestion).   EPINEPHrine  0.3 mg/0.3 mL IJ SOAJ injection Inject 0.3 mg into the muscle as needed for anaphylaxis.   lansoprazole (PREVACID) 15 MG capsule Take 15 mg by mouth daily.   metoprolol  succinate (TOPROL -XL) 50 MG 24 hr tablet Take 1.5 tablets (75 mg total) by mouth daily. Take with or immediately following a meal.    Multiple Vitamin (MULTIVITAMIN WITH MINERALS) TABS tablet Take 1 tablet by mouth in the morning. Centrum for Men/Unknown sterenght   psyllium (METAMUCIL SMOOTH TEXTURE) 28 % packet Take 1 packet by mouth in the morning. Unknown strenght   sildenafil (VIAGRA) 100 MG tablet Take 100 mg by mouth daily.   tamsulosin (FLOMAX) 0.4 MG CAPS capsule Take 0.4 mg by mouth daily.     Allergies:   Penicillin g and Vancomycin    Social History   Socioeconomic History   Marital status: Married    Spouse name: Not on file   Number of children: Not on file   Years of education: Not on file   Highest education level: Not on file  Occupational History   Not on file  Tobacco Use   Smoking status: Former    Current packs/day: 0.00    Average packs/day: 1 pack/day for 15.0 years (15.0 ttl pk-yrs)    Types: Cigarettes    Start date: 09/10/1992    Quit date: 09/11/2007    Years since quitting: 15.7   Smokeless tobacco: Never  Vaping Use   Vaping status: Never Used  Substance and Sexual Activity   Alcohol use: Yes    Alcohol/week: 1.0 standard drink of alcohol    Types: 1 Cans of beer per week    Comment: socially   Drug use: Never   Sexual activity: Not on file  Other Topics Concern   Not on file  Social History Narrative   Not on file   Social Drivers of Health   Financial Resource Strain: Not on file  Food Insecurity: Not on file  Transportation Needs: Not on file  Physical Activity: Not on file  Stress: Not on file  Social Connections: Not on file     Family History: The patient's family history includes Bone cancer in his maternal grandfather; Hypertension in his father and mother; Mitral valve prolapse in his mother; Stroke in his maternal grandfather. ROS:   Please see the history of present illness.    All 14 point review of systems negative except as described per history of present illness  EKGs/Labs/Other Studies Reviewed:         Recent Labs: 03/30/2023: ALT 29; BUN 11;  Creatinine, Ser 0.87; Hemoglobin 17.6; Platelets 209; Potassium 4.2; Sodium 140; TSH 4.780  Recent Lipid Panel    Component Value Date/Time   CHOL 159 03/30/2023 0826   TRIG 195 (H) 03/30/2023 0826   HDL 43 03/30/2023 0826   CHOLHDL 3.7 03/30/2023 0826   LDLCALC 83 03/30/2023 0826    Physical Exam:    VS:  BP 108/78 (BP Location: Right Arm, Patient Position: Sitting)   Pulse 63   Ht 5\' 10"  (1.778 m)   Wt 281 lb 3.2 oz (127.6 kg)   SpO2 97%   BMI 40.35 kg/m     Wt Readings from Last 3 Encounters:  05/25/23  281 lb 3.2 oz (127.6 kg)  03/30/23 278 lb (126.1 kg)  10/01/22 278 lb 12.8 oz (126.5 kg)     GEN:  Well nourished, well developed in no acute distress HEENT: Normal NECK: No JVD; No carotid bruits LYMPHATICS: No lymphadenopathy CARDIAC: RRR, no murmurs, no rubs, no gallops RESPIRATORY:  Clear to auscultation without rales, wheezing or rhonchi  ABDOMEN: Soft, non-tender, non-distended MUSCULOSKELETAL:  No edema; No deformity  SKIN: Warm and dry LOWER EXTREMITIES: no swelling NEUROLOGIC:  Alert and oriented x 3 PSYCHIATRIC:  Normal affect   ASSESSMENT:    1. Chronic diastolic congestive heart failure (HCC)   2. Essential hypertension   3. S/P AVR (aortic valve replacement)   4. Dyslipidemia    PLAN:    In order of problems listed above:  Chronic diastolic congestive heart failure, compensated on physical exam continue present management. Status post aortic valve replacement echocardiogram reviewed normal function. Essential hypertension blood pressure actually slow I will cut down his metoprolol  from 75 to 50 mg daily see if he feels any better. Profound weakness fatigue and tiredness he does have elevated TSH and more about hypothyroidism.  Will schedule him to have TSH and thyroid profile done within the next few weeks and then treat accordingly. Dyslipidemia I did review K PN which show me his LDL 83 HDL is 43 this is from February continue present  management   Medication Adjustments/Labs and Tests Ordered: Current medicines are reviewed at length with the patient today.  Concerns regarding medicines are outlined above.  No orders of the defined types were placed in this encounter.  Medication changes: No orders of the defined types were placed in this encounter.   Signed, Manfred Seed, MD, [place] 05/25/2023 9:44 AM    [facility]

## 2023-05-25 NOTE — Addendum Note (Signed)
 Addended by: Shawnee Dellen D on: 05/25/2023 10:06 AM   Modules accepted: Orders

## 2023-05-25 NOTE — Patient Instructions (Signed)
 Medication Instructions:   DECREASE: Metoprolol  Succinate to 50mg  daily    Lab Work: Your physician recommends that you return for lab work in: 3 weeks You need to have labs done when you are fasting.  You can come Monday through Friday 8:30 am to 12:00 pm and 1:15 to 4:30. You do not need to make an appointment as the order has already been placed. The labs you are going to have done are Thyroid panel, TSH    Testing/Procedures: None Ordered   Follow-Up: At [facility], you and your health needs are our priority.  As part of our continuing mission to provide you with exceptional heart care, we have created designated Provider Care Teams.  These Care Teams include your primary Cardiologist (physician) and Advanced Practice Providers (APPs -  Physician Assistants and Nurse Practitioners) who all work together to provide you with the care you need, when you need it.  We recommend signing up for the patient portal called "MyChart".  Sign up information is provided on this After Visit Summary.  MyChart is used to connect with patients for Virtual Visits (Telemedicine).  Patients are able to view lab/test results, encounter notes, upcoming appointments, etc.  Non-urgent messages can be sent to your provider as well.   To learn more about what you can do with MyChart, go to [email or web address].    Your next appointment:   3 month(s)  The format for your next appointment:   In Person  Provider:   Ralene Burger, MD    Other Instructions NA

## 2023-06-11 LAB — THYROID PANEL WITH TSH
Free Thyroxine Index: 1.5 (ref 1.2–4.9)
T3 Uptake Ratio: 27 % (ref 24–39)
T4, Total: 5.7 ug/dL (ref 4.5–12.0)
TSH: 3.68 u[IU]/mL (ref 0.450–4.500)

## 2023-06-23 ENCOUNTER — Ambulatory Visit: Payer: Self-pay | Admitting: Cardiology

## 2023-07-01 ENCOUNTER — Encounter: Payer: Self-pay | Admitting: Cardiology

## 2023-07-02 ENCOUNTER — Telehealth: Payer: Self-pay

## 2023-07-02 MED ORDER — AZITHROMYCIN 500 MG PO TABS: 500.0000 mg | ORAL_TABLET | ORAL | 3 refills | Status: AC

## 2023-07-02 NOTE — Telephone Encounter (Signed)
 Opened in error

## 2023-07-02 NOTE — Telephone Encounter (Signed)
 Zithromax  500mg  #1 sent to [facility] for dental procedure pre medication per Dr. Krasowski

## 2023-09-01 ENCOUNTER — Ambulatory Visit: Attending: Cardiology | Admitting: Cardiology

## 2023-09-01 VITALS — BP 120/72 | HR 66 | Ht 70.0 in | Wt 286.2 lb

## 2023-09-01 DIAGNOSIS — E785 Hyperlipidemia, unspecified: Secondary | ICD-10-CM | POA: Diagnosis not present

## 2023-09-01 DIAGNOSIS — Z953 Presence of xenogenic heart valve: Secondary | ICD-10-CM

## 2023-09-01 DIAGNOSIS — I10 Essential (primary) hypertension: Secondary | ICD-10-CM | POA: Diagnosis not present

## 2023-09-01 DIAGNOSIS — I5032 Chronic diastolic (congestive) heart failure: Secondary | ICD-10-CM | POA: Diagnosis not present

## 2023-09-01 DIAGNOSIS — R0609 Other forms of dyspnea: Secondary | ICD-10-CM

## 2023-09-01 NOTE — Progress Notes (Unsigned)
 Cardiology Office Note:    Date:  09/01/2023   ID:  Jackson Roberts, DOB 1965-05-24, MRN [id number]  PCP:  Sabas Norleen PARAS., MD  Cardiologist:  Lamar Fitch, MD    Referring MD: Sabas Norleen PARAS., MD   Chief Complaint  Patient presents with   Follow-up    History of Present Illness:    Jackson Roberts is a 58 y.o. male past medical history significant for aortic insufficiency that eventually required aortic valve replacement which was done in 2022.  He did receive 27 mm Inspira valve additional problem include emphysema, GERD, dyslipidemia, incomplete right bundle branch block, obstructive sleep apnea, chronic diastolic congestive heart failure.  Comes today to my office for follow-up after 3 months.  Last time I have seen him he was complaining of being weak tired and exhausted, we could not identify any clear-cut reason for that thyroid  was a little off.  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   Dizziness 01/17/2020   Dyslipidemia    Emphysema of lung (HCC)    Essential hypertension    Fatty liver    GERD (gastroesophageal reflux disease)    Heart murmur    due to arotic insufficiency   Incidental pulmonary nodule, > 3mm and < 8mm 05/01/2020   Right middle lobe - needs f/u CT   Mixed hyperlipidemia    Nonrheumatic aortic valve insufficiency    Plantar fasciitis of right foot 07/17/2015   S/P aortic valve replacement with bioprosthetic valve 06/04/2020   27 mm [employer] Inspiris Resilia Bioprosthetic Valve  SN: [id number]   S/P AVR (aortic valve replacement) 06/04/2020   Sleep apnea    wears CPAP    Past Surgical History:  Procedure Laterality Date   AORTIC VALVE REPLACEMENT N/A 06/04/2020    Procedure: AORTIC VALVE REPLACEMENT (AVR)  using Inspiris Valve size 27mm;  Surgeon: Dusty Sudie DEL, MD;  Location: [facility] OR;  Service: Open Heart Surgery;  Laterality: N/A;   RIGHT/LEFT HEART CATH AND CORONARY ANGIOGRAPHY N/A 03/15/2020   Procedure: RIGHT/LEFT HEART CATH AND CORONARY ANGIOGRAPHY;  Surgeon: Wonda Sharper, MD;  Location: [facility] INVASIVE CV LAB;  Service: Cardiovascular;  Laterality: N/A;   TEE WITHOUT CARDIOVERSION N/A 05/08/2020   Procedure: TRANSESOPHAGEAL ECHOCARDIOGRAM (TEE);  Surgeon: Francyne Headland, MD;  Location: [facility] ENDOSCOPY;  Service: Cardiovascular;  Laterality: N/A;   TEE WITHOUT CARDIOVERSION N/A 06/04/2020   Procedure: TRANSESOPHAGEAL ECHOCARDIOGRAM (TEE);  Surgeon: Dusty Sudie DEL, MD;  Location: [facility] OR;  Service: Open Heart Surgery;  Laterality: N/A;    Current Medications: Current Meds  Medication Sig   acetaminophen  (TYLENOL ) 500 MG tablet Take 1-2 tablets (500-1,000 mg total) by mouth every 6 (six) hours as needed (pain).   albuterol  (VENTOLIN  HFA) 108 (90 Base) MCG/ACT inhaler Inhale 2 puffs into the lungs every 6 (six) hours as needed for wheezing or shortness of breath.   Ascorbic Acid (VITAMIN C) 1000 MG tablet Take 1,000 mg by mouth in the morning.   aspirin  325 MG EC tablet Take 1 tablet (325 mg total) by mouth daily.   azithromycin  (ZITHROMAX ) 500 MG tablet Take 1 tablet (500 mg total) by mouth as directed. 1-2 hours prior to dental procedure   candesartan  (ATACAND ) 8 MG tablet  Take 1 tablet (8 mg total) by mouth in the morning.   cetirizine-pseudoephedrine (ZYRTEC-D) 5-120 MG tablet Take 1 tablet by mouth 2 (two) times daily as needed for allergies.   Cholecalciferol (VITAMIN D3) 5000 units TABS Take 5,000 Units by mouth in the morning.   dextromethorphan-guaiFENesin (MUCINEX DM) 30-600 MG 12hr tablet Take 1 tablet by mouth 2 (two) times daily as needed for cough (congestion).   EPINEPHrine  0.3 mg/0.3 mL IJ SOAJ injection Inject 0.3 mg into the muscle as needed  for anaphylaxis.   lansoprazole (PREVACID) 15 MG capsule Take 15 mg by mouth daily.   metoprolol  succinate (TOPROL -XL) 50 MG 24 hr tablet Take 1 tablet (50 mg total) by mouth daily. Take with or immediately following a meal.   Multiple Vitamin (MULTIVITAMIN WITH MINERALS) TABS tablet Take 1 tablet by mouth in the morning. Centrum for Men/Unknown sterenght   pravastatin  (PRAVACHOL ) 20 MG tablet Take 1 tablet (20 mg total) by mouth every evening.   psyllium (METAMUCIL SMOOTH TEXTURE) 28 % packet Take 1 packet by mouth in the morning. Unknown strenght   sildenafil (VIAGRA) 100 MG tablet Take 100 mg by mouth daily.   tamsulosin (FLOMAX) 0.4 MG CAPS capsule Take 0.4 mg by mouth daily.     Allergies:   Penicillin g and Vancomycin    Social History   Socioeconomic History   Marital status: Married    Spouse name: Not on file   Number of children: Not on file   Years of education: Not on file   Highest education level: Not on file  Occupational History   Not on file  Tobacco Use   Smoking status: Former    Current packs/day: 0.00    Average packs/day: 1 pack/day for 15.0 years (15.0 ttl pk-yrs)    Types: Cigarettes    Start date: 09/10/1992    Quit date: 09/11/2007    Years since quitting: 15.9   Smokeless tobacco: Never  Vaping Use   Vaping status: Never Used  Substance and Sexual Activity   Alcohol use: Yes    Alcohol/week: 1.0 standard drink of alcohol    Types: 1 Cans of beer per week    Comment: socially   Drug use: Never   Sexual activity: Not on file  Other Topics Concern   Not on file  Social History Narrative   Not on file   Social Drivers of Health   Financial Resource Strain: Not on file  Food Insecurity: Not on file  Transportation Needs: Not on file  Physical Activity: Not on file  Stress: Not on file  Social Connections: Not on file     Family History: The patient's family history includes Bone cancer in his maternal grandfather; Hypertension in his father and  mother; Mitral valve prolapse in his mother; Stroke in his maternal grandfather. ROS:   Please see the history of present illness.    All 14 point review of systems negative except as described per history of present illness  EKGs/Labs/Other Studies Reviewed:         Recent Labs: 03/30/2023: ALT 29; BUN 11; Creatinine, Ser 0.87; Hemoglobin 17.6; Platelets 209; Potassium 4.2; Sodium 140 06/10/2023: TSH 3.680  Recent Lipid Panel    Component Value Date/Time   CHOL 159 03/30/2023 0826   TRIG 195 (H) 03/30/2023 0826   HDL 43 03/30/2023 0826   CHOLHDL 3.7 03/30/2023 0826   LDLCALC 83 03/30/2023 0826    Physical Exam:    VS:  BP 120/72 (BP Location:  Left Arm, Patient Position: Sitting)   Pulse 66   Ht 5' 10 (1.778 m)   Wt 286 lb 3.2 oz (129.8 kg)   SpO2 92%   BMI 41.07 kg/m     Wt Readings from Last 3 Encounters:  09/01/23 286 lb 3.2 oz (129.8 kg)  05/25/23 281 lb 3.2 oz (127.6 kg)  03/30/23 278 lb (126.1 kg)     GEN:  Well nourished, well developed in no acute distress HEENT: Normal NECK: No JVD; No carotid bruits LYMPHATICS: No lymphadenopathy CARDIAC: RRR, systolic ejection murmur grade 1/6 to 2/6 pressure right upper portion of the sternum, no rubs, no gallops RESPIRATORY:  Clear to auscultation without rales, wheezing or rhonchi  ABDOMEN: Soft, non-tender, non-distended MUSCULOSKELETAL:  No edema; No deformity  SKIN: Warm and dry LOWER EXTREMITIES: no swelling NEUROLOGIC:  Alert and oriented x 3 PSYCHIATRIC:  Normal affect   ASSESSMENT:    1. Chronic diastolic congestive heart failure (HCC)   2. Essential hypertension   3. Dyslipidemia   4. S/P aortic valve replacement with bioprosthetic valve    PLAN:    In order of problems listed above:  Status post aortic valve replacement, clinically doing well, we will schedule him to have another echocardiogram in February next year and then I see him shortly after that. Essential hypertension blood pressure  well-controlled continue present management. Dyslipidemia I did review K PN LDL 83 HDL 43 this is from February.  Will continue present management. Chronic diastolic congestive heart failure compensated   Medication Adjustments/Labs and Tests Ordered: Current medicines are reviewed at length with the patient today.  Concerns regarding medicines are outlined above.  No orders of the defined types were placed in this encounter.  Medication changes: No orders of the defined types were placed in this encounter.   Signed, Lamar DOROTHA Fitch, MD, [facility] 09/01/2023 8:46 AM    [facility]

## 2023-09-01 NOTE — Patient Instructions (Signed)
 Medication Instructions:  Your physician recommends that you continue on your current medications as directed. Please refer to the Current Medication list given to you today.  *If you need a refill on your cardiac medications before your next appointment, please call your pharmacy*   Lab Work: None Ordered If you have labs (blood work) drawn today and your tests are completely normal, you will receive your results only by: MyChart Message (if you have MyChart) OR A paper copy in the mail If you have any lab test that is abnormal or we need to change your treatment, we will call you to review the results.   Testing/Procedures: FEB Your physician has requested that you have an echocardiogram. Echocardiography is a painless test that uses sound waves to create images of your heart. It provides your doctor with information about the size and shape of your heart and how well your heart's chambers and valves are working. This procedure takes approximately one hour. There are no restrictions for this procedure. Please do NOT wear cologne, perfume, aftershave, or lotions (deodorant is allowed). Please arrive 15 minutes prior to your appointment time.  Please note: We ask at that you not bring children with you during ultrasound (echo/ vascular) testing. Due to room size and safety concerns, children are not allowed in the ultrasound rooms during exams. Our front office staff cannot provide observation of children in our lobby area while testing is being conducted. An adult accompanying a patient to their appointment will only be allowed in the ultrasound room at the discretion of the ultrasound technician under special circumstances. We apologize for any inconvenience.    Follow-Up: At [facility], you and your health needs are our priority.  As part of our continuing mission to provide you with exceptional heart care, we have created designated Provider Care Teams.  These Care Teams include your  primary Cardiologist (physician) and Advanced Practice Providers (APPs -  Physician Assistants and Nurse Practitioners) who all work together to provide you with the care you need, when you need it.  We recommend signing up for the patient portal called MyChart.  Sign up information is provided on this After Visit Summary.  MyChart is used to connect with patients for Virtual Visits (Telemedicine).  Patients are able to view lab/test results, encounter notes, upcoming appointments, etc.  Non-urgent messages can be sent to your provider as well.   To learn more about what you can do with MyChart, go to [email or web address].    Your next appointment:   7 month(s)- Feb  The format for your next appointment:   In Person  Provider:   Lamar Fitch, MD    Other Instructions NA

## 2024-01-19 ENCOUNTER — Other Ambulatory Visit: Payer: Self-pay | Admitting: Emergency Medicine

## 2024-01-19 ENCOUNTER — Encounter: Payer: Self-pay | Admitting: Cardiology

## 2024-03-06 ENCOUNTER — Encounter: Payer: Self-pay | Admitting: Cardiology

## 2024-03-07 ENCOUNTER — Other Ambulatory Visit

## 2024-03-28 ENCOUNTER — Ambulatory Visit
